# Patient Record
Sex: Female | Born: 1985 | Race: White | Hispanic: No | Marital: Married | Smoking: Never smoker
Health system: Southern US, Community
[De-identification: ages and names within clinical notes are randomized; demographics above are authoritative.]

## PROBLEM LIST (undated history)

## (undated) DIAGNOSIS — Z803 Family history of malignant neoplasm of breast: Secondary | ICD-10-CM

## (undated) DIAGNOSIS — F419 Anxiety disorder, unspecified: Secondary | ICD-10-CM

## (undated) DIAGNOSIS — Z1501 Genetic susceptibility to malignant neoplasm of breast: Secondary | ICD-10-CM

## (undated) DIAGNOSIS — Z1509 Genetic susceptibility to other malignant neoplasm: Secondary | ICD-10-CM

## (undated) DIAGNOSIS — Z8481 Family history of carrier of genetic disease: Secondary | ICD-10-CM

## (undated) HISTORY — DX: Genetic susceptibility to other malignant neoplasm: Z15.09

## (undated) HISTORY — PX: WISDOM TOOTH EXTRACTION: SHX21

## (undated) HISTORY — DX: Genetic susceptibility to malignant neoplasm of breast: Z15.01

## (undated) HISTORY — PX: BREAST BIOPSY: SHX20

## (undated) HISTORY — DX: Family history of carrier of genetic disease: Z84.81

## (undated) HISTORY — DX: Family history of malignant neoplasm of breast: Z80.3

---

## 2017-04-18 NOTE — L&D Delivery Note (Signed)
Delivery Note Pt pushed well.  At 5:27 AM a viable female was delivered via Vaginal, Spontaneous (Presentation: vtx; ROA ).  APGAR: 8, 9; weight  pending.  Baby initially did well, good sats, NICU called for eval due to grunting Placenta status: spontaneous, intact.  Cord:  with the following complications: none.  Anesthesia:  Epidural Episiotomy: None Lacerations: 1st degree Suture Repair: 3.0 vicryl rapide Est. Blood Loss (mL): 200  Mom to postpartum.  Baby to Couplet care / Skin to Skin, possibly to NICU.  Will do circ in hospital prior to discharge  Leighton Roachodd D Valori Hollenkamp 06/18/2017, 5:53 AM

## 2017-06-17 ENCOUNTER — Encounter (HOSPITAL_COMMUNITY): Payer: Self-pay | Admitting: *Deleted

## 2017-06-17 ENCOUNTER — Inpatient Hospital Stay (HOSPITAL_COMMUNITY)
Admission: AD | Admit: 2017-06-17 | Discharge: 2017-06-20 | DRG: 805 | Disposition: A | Discharge status: Home / Self Care | Payer: Commercial Managed Care - PPO | Source: Ambulatory Visit | Attending: Obstetrics and Gynecology | Admitting: Obstetrics and Gynecology

## 2017-06-17 DIAGNOSIS — O99824 Streptococcus B carrier state complicating childbirth: Secondary | ICD-10-CM | POA: Diagnosis present

## 2017-06-17 DIAGNOSIS — O99214 Obesity complicating childbirth: Secondary | ICD-10-CM | POA: Diagnosis present

## 2017-06-17 DIAGNOSIS — Z3A35 35 weeks gestation of pregnancy: Secondary | ICD-10-CM

## 2017-06-17 DIAGNOSIS — F419 Anxiety disorder, unspecified: Secondary | ICD-10-CM | POA: Diagnosis present

## 2017-06-17 DIAGNOSIS — O42919 Preterm premature rupture of membranes, unspecified as to length of time between rupture and onset of labor, unspecified trimester: Secondary | ICD-10-CM | POA: Diagnosis present

## 2017-06-17 DIAGNOSIS — O42913 Preterm premature rupture of membranes, unspecified as to length of time between rupture and onset of labor, third trimester: Principal | ICD-10-CM | POA: Diagnosis present

## 2017-06-17 DIAGNOSIS — O99344 Other mental disorders complicating childbirth: Secondary | ICD-10-CM | POA: Diagnosis present

## 2017-06-17 DIAGNOSIS — E669 Obesity, unspecified: Secondary | ICD-10-CM | POA: Diagnosis present

## 2017-06-17 HISTORY — DX: Anxiety disorder, unspecified: F41.9

## 2017-06-17 LAB — POCT FERN TEST: POCT Fern Test: POSITIVE

## 2017-06-17 MED ORDER — SERTRALINE HCL 100 MG PO TABS
100.0000 mg | ORAL_TABLET | Freq: Every day | ORAL | Status: DC
  Administered 2017-06-18: 100 mg via ORAL
  Filled 2017-06-17: qty 1

## 2017-06-17 MED ORDER — BETAMETHASONE SOD PHOS & ACET 6 (3-3) MG/ML IJ SUSP
12.0000 mg | INTRAMUSCULAR | Status: DC
  Administered 2017-06-17: 12 mg via INTRAMUSCULAR
  Filled 2017-06-17: qty 2

## 2017-06-17 NOTE — MAU Provider Note (Signed)
History     CSN: [id number]  Arrival date and time: 06/17/17 2242   First Provider Initiated Contact with Patient 06/17/17 2316      Chief Complaint  Patient presents with  . Rupture of Membranes   HPI Terri Phillips is a 32 y.o. G1P0 at [redacted]w[redacted]d who presents with possible rupture of membranes. She states around 10pm she noticed a big gush of clear fluid and has continued to leak clear to pink tinged fluid since. She reports some mucous discharge as well. She reports some tightening in her abdomen but no pain. Reports good fetal movement. Denies any problems in this pregnancy.   OB History    Gravida Para Term Preterm AB Living   1             SAB TAB Ectopic Multiple Live Births                  Past Medical History:  Diagnosis Date  . Anxiety     Past Surgical History:  Procedure Laterality Date  . WISDOM TOOTH EXTRACTION      Family History  Problem Relation Age of Onset  . Cancer Mother     Social History   Tobacco Use  . Smoking status: Never Smoker  . Smokeless tobacco: Never Used  Substance Use Topics  . Alcohol use: No    Frequency: Never  . Drug use: No    Allergies: Not on File  Medications Prior to Admission  Medication Sig Dispense Refill Last Dose  . Prenatal Vit-Fe Fumarate-FA (PRENATAL MULTIVITAMIN) TABS tablet Take 1 tablet by mouth daily at 12 noon.   06/17/2017 at Unknown time  . sertraline (ZOLOFT) 100 MG tablet Take 100 mg by mouth daily.   06/17/2017 at Unknown time    Review of Systems  Constitutional: Negative.  Negative for fatigue and fever.  HENT: Negative.   Respiratory: Negative.  Negative for shortness of breath.   Cardiovascular: Negative.  Negative for chest pain.  Gastrointestinal: Negative.  Negative for abdominal pain, constipation, diarrhea, nausea and vomiting.  Genitourinary: Positive for vaginal discharge. Negative for dysuria.  Neurological: Negative.  Negative for dizziness and headaches.   Physical Exam   Blood  pressure 124/78, pulse 88, temperature 98.3 F (36.8 C), resp. rate 18, height 5\' 4"  (1.626 m), weight 176 lb (79.8 kg).  Physical Exam  Nursing note and vitals reviewed. Constitutional: She is oriented to person, place, and time. She appears well-developed and well-nourished. No distress.  HENT:  Head: Normocephalic.  Eyes: Pupils are equal, round, and reactive to light.  Cardiovascular: Normal rate, regular rhythm and normal heart sounds.  Respiratory: Effort normal and breath sounds normal. No respiratory distress.  GI: Soft. Bowel sounds are normal. She exhibits no distension. There is no tenderness.  Neurological: She is alert and oriented to person, place, and time.  Skin: Skin is warm and dry.  Psychiatric: She has a normal mood and affect. Her behavior is normal. Judgment and thought content normal.    MAU Course  Procedures Results for orders placed or performed during the hospital encounter of 06/17/17 (from the past 24 hour(s))  Fern Test     Status: Normal   Collection Time: 06/17/17 11:22 PM  Result Value Ref Range   POCT Fern Test Positive = ruptured amniotic membanes    MDM Fern test Consulted with Dr. Jackelyn Knife- will give BMZ and collect GBS swab. MD to put in orders for admission.   Assessment and  Plan   1. Preterm premature rupture of membranes in third trimester, unspecified duration to onset of labor   2. [redacted] weeks gestation of pregnancy    Care turned over to Dr. Jackelyn KnifeMeisinger.  Rolm BookbinderCaroline M Neill CNM 06/17/2017, 11:21 PM

## 2017-06-17 NOTE — MAU Note (Signed)
About an hour ago started leaking clear fld with some bloody mucous noted at times. Cont to leak fld off and on. No pain

## 2017-06-18 ENCOUNTER — Encounter (HOSPITAL_COMMUNITY): Payer: Self-pay

## 2017-06-18 ENCOUNTER — Inpatient Hospital Stay (HOSPITAL_COMMUNITY): Payer: Commercial Managed Care - PPO | Admitting: Anesthesiology

## 2017-06-18 ENCOUNTER — Other Ambulatory Visit: Payer: Self-pay

## 2017-06-18 DIAGNOSIS — E669 Obesity, unspecified: Secondary | ICD-10-CM | POA: Diagnosis present

## 2017-06-18 DIAGNOSIS — F419 Anxiety disorder, unspecified: Secondary | ICD-10-CM | POA: Diagnosis present

## 2017-06-18 DIAGNOSIS — O99344 Other mental disorders complicating childbirth: Secondary | ICD-10-CM | POA: Diagnosis present

## 2017-06-18 DIAGNOSIS — Z3A35 35 weeks gestation of pregnancy: Secondary | ICD-10-CM | POA: Diagnosis not present

## 2017-06-18 DIAGNOSIS — O42919 Preterm premature rupture of membranes, unspecified as to length of time between rupture and onset of labor, unspecified trimester: Secondary | ICD-10-CM | POA: Diagnosis present

## 2017-06-18 DIAGNOSIS — O42913 Preterm premature rupture of membranes, unspecified as to length of time between rupture and onset of labor, third trimester: Secondary | ICD-10-CM | POA: Diagnosis present

## 2017-06-18 DIAGNOSIS — O99824 Streptococcus B carrier state complicating childbirth: Secondary | ICD-10-CM | POA: Diagnosis present

## 2017-06-18 DIAGNOSIS — O99214 Obesity complicating childbirth: Secondary | ICD-10-CM | POA: Diagnosis present

## 2017-06-18 LAB — RPR: RPR Ser Ql: NONREACTIVE

## 2017-06-18 LAB — CBC
HCT: 34.4 % — ABNORMAL LOW (ref 36.0–46.0)
HEMOGLOBIN: 11.7 g/dL — AB (ref 12.0–15.0)
MCH: 30.8 pg (ref 26.0–34.0)
MCHC: 34 g/dL (ref 30.0–36.0)
MCV: 90.5 fL (ref 78.0–100.0)
PLATELETS: 195 10*3/uL (ref 150–400)
RBC: 3.8 MIL/uL — AB (ref 3.87–5.11)
RDW: 13.9 % (ref 11.5–15.5)
WBC: 13.5 10*3/uL — AB (ref 4.0–10.5)

## 2017-06-18 LAB — GROUP B STREP BY PCR: Group B strep by PCR: POSITIVE — AB

## 2017-06-18 MED ORDER — BUTORPHANOL TARTRATE 1 MG/ML IJ SOLN: 1.0000 mg | INTRAMUSCULAR | Status: DC | PRN

## 2017-06-18 MED ORDER — EPHEDRINE 5 MG/ML INJ
10.0000 mg | INTRAVENOUS | Status: DC | PRN
  Filled 2017-06-18: qty 2

## 2017-06-18 MED ORDER — DIPHENHYDRAMINE HCL 50 MG/ML IJ SOLN: 12.5000 mg | INTRAMUSCULAR | Status: DC | PRN

## 2017-06-18 MED ORDER — ACETAMINOPHEN 325 MG PO TABS: 650.0000 mg | ORAL_TABLET | ORAL | Status: DC | PRN

## 2017-06-18 MED ORDER — TETANUS-DIPHTH-ACELL PERTUSSIS 5-2.5-18.5 LF-MCG/0.5 IM SUSP: 0.5000 mL | Freq: Once | INTRAMUSCULAR | Status: DC

## 2017-06-18 MED ORDER — FENTANYL 2.5 MCG/ML BUPIVACAINE 1/10 % EPIDURAL INFUSION (WH - ANES)
14.0000 mL/h | INTRAMUSCULAR | Status: DC | PRN
  Administered 2017-06-18: 14 mL/h via EPIDURAL
  Filled 2017-06-18: qty 100

## 2017-06-18 MED ORDER — SERTRALINE HCL 100 MG PO TABS
100.0000 mg | ORAL_TABLET | Freq: Every day | ORAL | Status: DC
  Administered 2017-06-18 – 2017-06-19 (×2): 100 mg via ORAL
  Filled 2017-06-18 (×3): qty 1

## 2017-06-18 MED ORDER — BENZOCAINE-MENTHOL 20-0.5 % EX AERO
1.0000 "application " | INHALATION_SPRAY | CUTANEOUS | Status: DC | PRN
  Administered 2017-06-18 – 2017-06-20 (×2): 1 via TOPICAL
  Filled 2017-06-18 (×2): qty 56

## 2017-06-18 MED ORDER — METHYLERGONOVINE MALEATE 0.2 MG PO TABS: 0.2000 mg | ORAL_TABLET | ORAL | Status: DC | PRN

## 2017-06-18 MED ORDER — OXYTOCIN BOLUS FROM INFUSION
500.0000 mL | Freq: Once | INTRAVENOUS | Status: AC
  Administered 2017-06-18: 500 mL via INTRAVENOUS

## 2017-06-18 MED ORDER — ONDANSETRON HCL 4 MG/2ML IJ SOLN: 4.0000 mg | Freq: Four times a day (QID) | INTRAMUSCULAR | Status: DC | PRN

## 2017-06-18 MED ORDER — LACTATED RINGERS IV SOLN: 500.0000 mL | Freq: Once | INTRAVENOUS | Status: DC

## 2017-06-18 MED ORDER — LACTATED RINGERS IV SOLN: 500.0000 mL | INTRAVENOUS | Status: DC | PRN

## 2017-06-18 MED ORDER — OXYCODONE HCL 5 MG PO TABS: 10.0000 mg | ORAL_TABLET | ORAL | Status: DC | PRN

## 2017-06-18 MED ORDER — OXYTOCIN 40 UNITS IN LACTATED RINGERS INFUSION - SIMPLE MED: 2.5000 [IU]/h | INTRAVENOUS | Status: DC

## 2017-06-18 MED ORDER — LIDOCAINE HCL (PF) 1 % IJ SOLN
INTRAMUSCULAR | Status: DC | PRN
  Administered 2017-06-18: 5 mL via EPIDURAL
  Administered 2017-06-18: 2 mL via EPIDURAL
  Administered 2017-06-18: 3 mL via EPIDURAL

## 2017-06-18 MED ORDER — OXYCODONE-ACETAMINOPHEN 5-325 MG PO TABS: 2.0000 | ORAL_TABLET | ORAL | Status: DC | PRN

## 2017-06-18 MED ORDER — OXYCODONE-ACETAMINOPHEN 5-325 MG PO TABS: 1.0000 | ORAL_TABLET | ORAL | Status: DC | PRN

## 2017-06-18 MED ORDER — ONDANSETRON HCL 4 MG PO TABS: 4.0000 mg | ORAL_TABLET | ORAL | Status: DC | PRN

## 2017-06-18 MED ORDER — OXYCODONE HCL 5 MG PO TABS: 5.0000 mg | ORAL_TABLET | ORAL | Status: DC | PRN

## 2017-06-18 MED ORDER — SENNOSIDES-DOCUSATE SODIUM 8.6-50 MG PO TABS
2.0000 | ORAL_TABLET | ORAL | Status: DC
  Administered 2017-06-19 – 2017-06-20 (×2): 2 via ORAL
  Filled 2017-06-18 (×2): qty 2

## 2017-06-18 MED ORDER — PRENATAL MULTIVITAMIN CH
1.0000 | ORAL_TABLET | Freq: Every day | ORAL | Status: DC
  Administered 2017-06-18 – 2017-06-20 (×3): 1 via ORAL
  Filled 2017-06-18 (×3): qty 1

## 2017-06-18 MED ORDER — MAGNESIUM HYDROXIDE 400 MG/5ML PO SUSP: 30.0000 mL | ORAL | Status: DC | PRN

## 2017-06-18 MED ORDER — SODIUM CHLORIDE 0.9 % IV SOLN
5.0000 10*6.[IU] | Freq: Once | INTRAVENOUS | Status: AC
  Administered 2017-06-18: 5 10*6.[IU] via INTRAVENOUS
  Filled 2017-06-18: qty 5

## 2017-06-18 MED ORDER — IBUPROFEN 600 MG PO TABS
600.0000 mg | ORAL_TABLET | Freq: Four times a day (QID) | ORAL | Status: DC
  Administered 2017-06-18 – 2017-06-20 (×10): 600 mg via ORAL
  Filled 2017-06-18 (×10): qty 1

## 2017-06-18 MED ORDER — ONDANSETRON HCL 4 MG/2ML IJ SOLN: 4.0000 mg | INTRAMUSCULAR | Status: DC | PRN

## 2017-06-18 MED ORDER — PENICILLIN G POT IN DEXTROSE 60000 UNIT/ML IV SOLN
3.0000 10*6.[IU] | INTRAVENOUS | Status: DC
  Filled 2017-06-18 (×3): qty 50

## 2017-06-18 MED ORDER — ZOLPIDEM TARTRATE 5 MG PO TABS: 5.0000 mg | ORAL_TABLET | Freq: Every evening | ORAL | Status: DC | PRN

## 2017-06-18 MED ORDER — PHENYLEPHRINE 40 MCG/ML (10ML) SYRINGE FOR IV PUSH (FOR BLOOD PRESSURE SUPPORT)
80.0000 ug | PREFILLED_SYRINGE | INTRAVENOUS | Status: DC | PRN
  Filled 2017-06-18: qty 5
  Filled 2017-06-18: qty 10

## 2017-06-18 MED ORDER — SOD CITRATE-CITRIC ACID 500-334 MG/5ML PO SOLN: 30.0000 mL | ORAL | Status: DC | PRN

## 2017-06-18 MED ORDER — TERBUTALINE SULFATE 1 MG/ML IJ SOLN: 0.2500 mg | Freq: Once | INTRAMUSCULAR | Status: DC | PRN

## 2017-06-18 MED ORDER — LACTATED RINGERS IV SOLN
INTRAVENOUS | Status: DC
  Administered 2017-06-18: via INTRAVENOUS

## 2017-06-18 MED ORDER — METHYLERGONOVINE MALEATE 0.2 MG/ML IJ SOLN: 0.2000 mg | INTRAMUSCULAR | Status: DC | PRN

## 2017-06-18 MED ORDER — LIDOCAINE HCL (PF) 1 % IJ SOLN
30.0000 mL | INTRAMUSCULAR | Status: AC | PRN
  Administered 2017-06-18: 30 mL via SUBCUTANEOUS
  Filled 2017-06-18: qty 30

## 2017-06-18 MED ORDER — LACTATED RINGERS IV SOLN
500.0000 mL | Freq: Once | INTRAVENOUS | Status: AC
  Administered 2017-06-18: 500 mL via INTRAVENOUS

## 2017-06-18 MED ORDER — COCONUT OIL OIL
1.0000 "application " | TOPICAL_OIL | Status: DC | PRN
  Administered 2017-06-18: 1 via TOPICAL
  Filled 2017-06-18: qty 120

## 2017-06-18 MED ORDER — DIBUCAINE 1 % RE OINT: 1.0000 "application " | TOPICAL_OINTMENT | RECTAL | Status: DC | PRN

## 2017-06-18 MED ORDER — SIMETHICONE 80 MG PO CHEW: 80.0000 mg | CHEWABLE_TABLET | ORAL | Status: DC | PRN

## 2017-06-18 MED ORDER — OXYTOCIN 40 UNITS IN LACTATED RINGERS INFUSION - SIMPLE MED
1.0000 m[IU]/min | INTRAVENOUS | Status: DC
  Administered 2017-06-18: 2 m[IU]/min via INTRAVENOUS
  Filled 2017-06-18: qty 1000

## 2017-06-18 MED ORDER — MEASLES, MUMPS & RUBELLA VAC ~~LOC~~ INJ
0.5000 mL | INJECTION | Freq: Once | SUBCUTANEOUS | Status: AC
  Administered 2017-06-19: 0.5 mL via SUBCUTANEOUS
  Filled 2017-06-18 (×2): qty 0.5

## 2017-06-18 MED ORDER — WITCH HAZEL-GLYCERIN EX PADS: 1.0000 "application " | MEDICATED_PAD | CUTANEOUS | Status: DC | PRN

## 2017-06-18 MED ORDER — PHENYLEPHRINE 40 MCG/ML (10ML) SYRINGE FOR IV PUSH (FOR BLOOD PRESSURE SUPPORT)
80.0000 ug | PREFILLED_SYRINGE | INTRAVENOUS | Status: DC | PRN
  Filled 2017-06-18: qty 5

## 2017-06-18 MED ORDER — DIPHENHYDRAMINE HCL 25 MG PO CAPS: 25.0000 mg | ORAL_CAPSULE | Freq: Four times a day (QID) | ORAL | Status: DC | PRN

## 2017-06-18 NOTE — Anesthesia Preprocedure Evaluation (Signed)
Anesthesia Evaluation  Patient identified by MRN, date of birth, ID band Patient awake    Reviewed: Allergy & Precautions, NPO status , Patient's Chart, lab work & pertinent test results  Airway Mallampati: II  TM Distance: >3 FB Neck ROM: Full    Dental  (+) Teeth Intact, Dental Advisory Given   Pulmonary neg pulmonary ROS,    Pulmonary exam normal breath sounds clear to auscultation       Cardiovascular negative cardio ROS Normal cardiovascular exam Rhythm:Regular Rate:Normal     Neuro/Psych PSYCHIATRIC DISORDERS Anxiety negative neurological ROS     GI/Hepatic negative GI ROS, Neg liver ROS,   Endo/Other  negative endocrine ROSObesity   Renal/GU negative Renal ROS     Musculoskeletal negative musculoskeletal ROS (+)   Abdominal   Peds  Hematology negative hematology ROS (+) Blood dyscrasia, anemia , Plt 195k   Anesthesia Other Findings Day of surgery medications reviewed with the patient.  Reproductive/Obstetrics (+) Pregnancy                             Anesthesia Physical Anesthesia Plan  ASA: II  Anesthesia Plan: Epidural   Post-op Pain Management:    Induction:   PONV Risk Score and Plan: 2 and Treatment may vary due to age or medical condition  Airway Management Planned:   Additional Equipment:   Intra-op Plan:   Post-operative Plan:   Informed Consent: I have reviewed the patients History and Physical, chart, labs and discussed the procedure including the risks, benefits and alternatives for the proposed anesthesia with the patient or authorized representative who has indicated his/her understanding and acceptance.   Dental advisory given  Plan Discussed with:   Anesthesia Plan Comments: (Patient identified. Risks/Benefits/Options discussed with patient including but not limited to bleeding, infection, nerve damage, paralysis, failed block, incomplete pain  control, headache, blood pressure changes, nausea, vomiting, reactions to medication both or allergic, itching and postpartum back pain. Confirmed with bedside nurse the patient's most recent platelet count. Confirmed with patient that they are not currently taking any anticoagulation, have any bleeding history or any family history of bleeding disorders. Patient expressed understanding and wished to proceed. All questions were answered. )        Anesthesia Quick Evaluation

## 2017-06-18 NOTE — Anesthesia Postprocedure Evaluation (Signed)
Anesthesia Post Note  Patient: Terri Phillips  Procedure(s) Performed: AN AD HOC LABOR EPIDURAL     Patient location during evaluation: Mother Baby Anesthesia Type: Epidural Level of consciousness: awake and alert and oriented Pain management: satisfactory to patient Vital Signs Assessment: post-procedure vital signs reviewed and stable Respiratory status: spontaneous breathing and nonlabored ventilation Cardiovascular status: stable Postop Assessment: no headache, no backache, no signs of nausea or vomiting, adequate PO intake and patient able to bend at knees (patient up walking) Anesthetic complications: no    Last Vitals:  Vitals:   06/18/17 0704 06/18/17 0750  BP: 116/65 (!) 143/71  Pulse: 64 65  Resp: 16 18  Temp: 36.8 C 36.8 C  SpO2: 100% 99%    Last Pain:  Vitals:   06/18/17 0750  TempSrc: Oral  PainSc:    Pain Goal:                 Madison HickmanGREGORY,Shalamar Plourde

## 2017-06-18 NOTE — Progress Notes (Signed)
PPD #0 With baby in NICU, no problems Afeb, VSS Continue routine postpartum care

## 2017-06-18 NOTE — Anesthesia Procedure Notes (Signed)
Epidural Patient location during procedure: OB Start time: 06/18/2017 4:21 AM End time: 06/18/2017 4:26 AM  Staffing Anesthesiologist: Cecile Hearingurk, Sharif Rendell Edward, MD Performed: anesthesiologist   Preanesthetic Checklist Completed: patient identified, pre-op evaluation, timeout performed, IV checked, risks and benefits discussed and monitors and equipment checked  Epidural Patient position: sitting Prep: DuraPrep Patient monitoring: blood pressure and continuous pulse ox Approach: midline Location: L3-L4 Injection technique: LOR air  Needle:  Needle type: Tuohy  Needle gauge: 17 G Needle length: 9 cm Needle insertion depth: 5 cm Catheter size: 19 Gauge Catheter at skin depth: 10 cm Test dose: negative and Other (1% Lidocaine)  Additional Notes Patient identified.  Risk benefits discussed including failed block, incomplete pain control, headache, nerve damage, paralysis, blood pressure changes, nausea, vomiting, reactions to medication both toxic or allergic, and postpartum back pain.  Patient expressed understanding and wished to proceed.  All questions were answered.  Sterile technique used throughout procedure and epidural site dressed with sterile barrier dressing. No paresthesia or other complications noted. The patient did not experience any signs of intravascular injection such as tinnitus or metallic taste in mouth nor signs of intrathecal spread such as rapid motor block. Please see nursing notes for vital signs. Reason for block:procedure for pain

## 2017-06-18 NOTE — H&P (Signed)
Terri Phillips is a 32 y.o. female, G2 19P0010, EGA [redacted]W[redacted]D with EDC 07-21-17 presenting for leaking fluid since around 2200 last pm.  Eval in MAU confirmed PPROM, VE 2 cm, occ ctx.  Pt admitted and started on pitocin, received a dose of betamethasone, rapid GBS sent and is positive so started on PCN.  Prenatal care essentially uncomplicated, possible vanishing twin at 6 weeks, anxiety controlled with Zoloft.  OB History    Gravida Para Term Preterm AB Living   2       1 0   SAB TAB Ectopic Multiple Live Births   1             Past Medical History:  Diagnosis Date  . Anxiety    Past Surgical History:  Procedure Laterality Date  . WISDOM TOOTH EXTRACTION     Family History: family history includes Cancer in her mother. Social History:  reports that  has never smoked. she has never used smokeless tobacco. She reports that she does not drink alcohol or use drugs.     Maternal Diabetes: No Genetic Screening: Normal Maternal Ultrasounds/Referrals: Normal Fetal Ultrasounds or other Referrals:  None Maternal Substance Abuse:  No Significant Maternal Medications:  Meds include: Zoloft Significant Maternal Lab Results:  Lab values include: Group B Strep positive Other Comments:  PPROM  Review of Systems  Respiratory: Negative.   Cardiovascular: Negative.    Maternal Medical History:  Reason for admission: Rupture of membranes.   Contractions: Frequency: irregular.   Perceived severity is mild.    Fetal activity: Perceived fetal activity is normal.    Prenatal complications: no prenatal complications Prenatal Complications - Diabetes: none.    Dilation: 10 Effacement (%): 70 Station: Plus 1, Plus 2 Exam by:: Jaclyn PrimeK Wheatley, RN(verified by Virl CageyBrooke Boyer, RN) Blood pressure (!) 104/48, pulse (!) 106, temperature 97.9 F (36.6 C), temperature source Oral, resp. rate 18, height 5\' 4"  (1.626 m), weight 176 lb (79.8 kg), SpO2 99 %. Maternal Exam:  Uterine Assessment: Contraction  strength is moderate.  Contraction frequency is regular.   Abdomen: Patient reports no abdominal tenderness. Estimated fetal weight is 5-6 lbs.   Fetal presentation: vertex  Introitus: Normal vulva. Normal vagina.  Ferning test: positive.  Amniotic fluid character: clear.  Pelvis: adequate for delivery.   Cervix: Cervix evaluated by digital exam.     Fetal Exam Fetal Monitor Review: Mode: ultrasound.   Baseline rate: 150.  Variability: moderate (6-25 bpm).   Pattern: accelerations present and variable decelerations.    Fetal State Assessment: Category II - tracings are indeterminate.     Physical Exam  Vitals reviewed. Constitutional: She appears well-developed and well-nourished.  Cardiovascular: Normal rate and regular rhythm.  Respiratory: Effort normal. No respiratory distress.  GI: Soft.    Prenatal labs: ABO, Rh: --/--/O NEG (03/03 0015) Antibody: POS (03/03 0015) Rubella:  non-immune RPR:   NR HBsAg:   Neg HIV:   NR GBS:   Pos  Assessment/Plan: IUP at 35 weeks with PPROM.  She just received her epidural and has progressed to complete, received betamethasone, is on PCN for +GBS.  Will anticipate SVD.     Leighton Roachodd D Raetta Agostinelli 06/18/2017, 5:06 AM

## 2017-06-19 NOTE — Progress Notes (Signed)
Patient ID: Terri MiresCarissa L Phillips, female   DOB: 10/11/1985, 32 y.o.   MRN: [id number] Pt seen and examined. Lochia mild, pain well controlled. Ambulating with no issues, voiding well. Tol PO. Baby still in NICU - bonding well VSS ABD - FF and 3cm below umb EXT +1 edema b/l, no homans  A/P: PPD#1 s/p preterm svd - stable; baby also stable in NICU         Routine pp care         Likely discharge to home in am

## 2017-06-19 NOTE — Lactation Note (Signed)
This note was copied from a baby's chart. Lactation Consultation Note  Patient Name: Boy Iva BoopCarissa Dady ZOXWR'UToday's Date: 06/19/2017 Reason for consult: Initial assessment;NICU baby;Late-preterm 34-36.6wks;Infant < 6lbs;Hyperbilirubinemia   Called to infant bedside in the NICU due to nipple trauma to the right breast. Mom was holding infant STS. Mom has a red ring around the right nipple presumably from incorrect flange size. Mom reports she used the 3027 and then the 24's. Reviewed importance of proper flange fit for best milk flow. Mom has suction turned up high also. Enc mom to start on 3 gtts and increase as can but decrease with any pain.   Follow up with mom in her room on 3rd floor. Flanges refit to a size 21 with instructions to increase to size 24 tomorrow. Mom reports # 21 flanges feel the best. Mom used coconut oil prior to pumping. Mom to pump 8-12 x a day with a 4-5 hour stretch of no pumping at night for rest. Enc mom to pump after holding/visiting infant. Mom to follow pumping with hand expression. Mom was taught how to do hands on pumping. Mom given comfort gels to use post pumping and was instructed how to use and clean.   Providing Breast milk for Your Baby in NICU given and explained. Mom to call out for feeding assistance as needed. Mom has been taking small gtts of milk to the NICU for the infant.   parents report all questions/concerns have been answered at this time.      Maternal Data Formula Feeding for Exclusion: No Has patient been taught Hand Expression?: Yes Does the patient have breastfeeding experience prior to this delivery?: No  Feeding Feeding Type: Donor Breast Milk Length of feed: 30 min  LATCH Score                   Interventions    Lactation Tools Discussed/Used WIC Program: No Pump Review: Setup, frequency, and cleaning;Milk Storage Initiated by:: Bedside RN, Enc 8-12 x a day   Consult Status Consult Status: Follow-up Date:  06/20/17 Follow-up type: In-patient    Silas FloodSharon S Takara Sermons 06/19/2017, 9:58 AM

## 2017-06-19 NOTE — Plan of Care (Signed)
  Role Relationship: Ability to demonstrate positive interaction with newborn will improve 06/19/2017 0502 - Not Met (add Reason) by Ledell Noss, RN  Baby in NICU Progressing Activity: Risk for activity intolerance will decrease 06/19/2017 0502 - Progressing by Ledell Noss, RN Nutrition: Adequate nutrition will be maintained 06/19/2017 0502 - Progressing by Ledell Noss, RN Coping: Level of anxiety will decrease 06/19/2017 0502 - Progressing by Ledell Noss, RN Elimination: Will not experience complications related to bowel motility 06/19/2017 0502 - Progressing by Ledell Noss, RN Will not experience complications related to urinary retention 06/19/2017 0502 - Progressing by Ledell Noss, RN Pain Managment: General experience of comfort will improve 06/19/2017 0502 - Progressing by Ledell Noss, RN Safety: Ability to remain free from injury will improve 06/19/2017 0502 - Progressing by Ledell Noss, RN Skin Integrity: Risk for impaired skin integrity will decrease 06/19/2017 0502 - Progressing by Ledell Noss, RN Skin Integrity: Demonstration of wound healing without infection will improve 06/19/2017 0502 - Progressing by Ledell Noss, RN

## 2017-06-19 NOTE — Progress Notes (Signed)
CSW acknowledges NICU admission.    Patient screened out for psychosocial assessment since none of the following apply:  Psychosocial stressors documented in mother or baby's chart  Gestation less than 32 weeks  Code at delivery   Infant with anomalies  Please contact the Clinical Social Worker if specific needs arise, or by MOB's request.     MOB with hx of Anxiety-onset in college 2008.  Has Rx for Zoloft and PNR states well controlled. 

## 2017-06-19 NOTE — Progress Notes (Signed)
Patient ID: Terri Phillips, female   DOB: 05/05/1985, 32 y.o.   MRN: [id number] Pt not in room - likely in NICU per nursing. Has been recovering well with no complaints per nursing.  VSS reviewed and wnl Will return to assess her personally when she is back in room Routine pp care

## 2017-06-20 MED ORDER — IBUPROFEN 600 MG PO TABS: 600.0000 mg | ORAL_TABLET | Freq: Four times a day (QID) | ORAL | 1 refills | Status: DC | PRN

## 2017-06-20 MED ORDER — PRENATAL MULTIVITAMIN CH: 1.0000 | ORAL_TABLET | Freq: Every day | ORAL | 3 refills | Status: DC

## 2017-06-20 NOTE — Lactation Note (Signed)
This note was copied from a baby's chart. Lactation Consultation Note  Patient Name: Boy Iva BoopCarissa Langer WUJWJ'XToday's Date: 06/20/2017 Reason for consult: Follow-up assessment;NICU baby Mom is pumping every 3 hours and obtaining 10-20 mls.  Breasts are filling with some firm areas noted.  No engorgement.  Instructed to continue to use hands on pumping 8-12 times/24 hours.  Reviewed pumping basics.  Mom is now using a 24 mm flange and feels this is comfortable.  Answered questions.  Encouraged to call with concerns or for NICU assist.  Outpatient services discussed.  Maternal Data    Feeding Feeding Type: Donor Breast Milk Length of feed: 30 min  LATCH Score                   Interventions    Lactation Tools Discussed/Used     Consult Status Consult Status: PRN    Huston FoleyMOULDEN, Kenadi Miltner S 06/20/2017, 10:55 AM

## 2017-06-20 NOTE — Progress Notes (Signed)
Post Partum Day 2 Subjective: no complaints, up ad lib, voiding, tolerating PO and nl lochia, pain controlled  Objective: Blood pressure 116/68, pulse 79, temperature 98.4 F (36.9 C), temperature source Oral, resp. rate 18, height 5\' 4"  (1.626 m), weight 79.8 kg (176 lb), SpO2 99 %, unknown if currently breastfeeding.  Physical Exam:  General: alert and no distress Lochia: appropriate Uterine Fundus: firm  Recent Labs    06/18/17 0015  HGB 11.7*  HCT 34.4*    Assessment/Plan: Discharge home.  Routine care.  D/c with motrin and PNV.  F/u 6 weeks   LOS: 2 days   Terri Phillips 06/20/2017, 7:50 AM

## 2017-06-20 NOTE — Progress Notes (Addendum)
Discharge instructions reviewed using Baby and Me book. Pre-e s/s, normal VD recovery and lochia reviewed, pt stating that she has had Tdap in the office prior to delivery. Medications listed and f/u.  Pt and FOB verbalizes understanding.Pt is planning to leave at 6pm after infant feeding in NICU.

## 2017-06-20 NOTE — Discharge Summary (Signed)
OB Discharge Summary     Patient Name: Terri Phillips DOB: 04-14-1986 MRN: [id number]  Date of admission: 06/17/2017 Delivering MD: Jackelyn Knife, TODD   Date of discharge: 06/20/2017  Admitting diagnosis: 30 WKS, WATER BROKE Intrauterine pregnancy: [redacted]w[redacted]d     Secondary diagnosis:  Active Problems:   Preterm premature rupture of membranes (PPROM) with unknown onset of labor   SVD (spontaneous vaginal delivery)  Additional problems: N/A     Discharge diagnosis: Preterm Pregnancy Delivered                                                                                                Post partum procedures:N/A  Augmentation: N/A  Complications: None  Hospital course:  Onset of Labor With Vaginal Delivery     32 y.o. yo G2P0111 at [redacted]w[redacted]d was admitted in Active Labor on 06/17/2017. Patient had an uncomplicated labor course as follows:  Membrane Rupture Time/Date: 9:30 PM ,06/17/2017   Intrapartum Procedures: Episiotomy: None [1]                                         Lacerations:  1st degree [2]  Patient had a delivery of a Viable infant. (baby A neg) 06/18/2017  Information for the patient's newborn:  Analeya, Luallen [[id number]]  Delivery Method: Vaginal, Spontaneous(Filed from Delivery Summary)    Pateint had an uncomplicated postpartum course.  She is ambulating, tolerating a regular diet, passing flatus, and urinating well. Patient is discharged home in stable condition on 06/20/17.   Physical exam  Vitals:   06/19/17 1600 06/19/17 1959 06/20/17 0655 06/20/17 0828  BP: 122/65 114/72 116/68 (!) 142/71  Pulse: 71 (!) 56 79 80  Resp: 18 18 18 18   Temp: 98.3 F (36.8 C) 98.4 F (36.9 C) 98.4 F (36.9 C) 98.6 F (37 C)  TempSrc: Oral Oral Oral Oral  SpO2: 99% 98% 99% 99%  Weight:      Height:       General: alert and no distress Lochia: appropriate Uterine Fundus: firm  Labs: Lab Results  Component Value Date   WBC 13.5 (H) 06/18/2017   HGB 11.7 (L) 06/18/2017   HCT 34.4 (L) 06/18/2017   MCV 90.5 06/18/2017   PLT 195 06/18/2017   No flowsheet data found.  Discharge instruction: per After Visit Summary and "Baby and Me Booklet".  After visit meds:  Allergies as of 06/20/2017   No Known Allergies     Medication List    TAKE these medications   ibuprofen 600 MG tablet Commonly known as:  ADVIL,MOTRIN Take 1 tablet (600 mg total) by mouth every 6 (six) hours as needed.   prenatal multivitamin Tabs tablet Take 1 tablet by mouth daily at 12 noon.   sertraline 100 MG tablet Commonly known as:  ZOLOFT Take 100 mg by mouth at bedtime.       Diet: routine diet  Activity: Advance as tolerated. Pelvic rest for 6 weeks.   Outpatient follow up:6 weeks Follow up  Appt:No future appointments. Follow up Visit:No Follow-up on file.  Postpartum contraception: Not Discussed  Newborn Data: Live born female  Birth Weight: 6 lb 0.5 oz (2735 g) APGAR: 8, 9  Newborn Delivery   Birth date/time:  06/18/2017 05:27:00 Delivery type:  Vaginal, Spontaneous     Baby Feeding: Breast Disposition:home with mother   06/20/2017 Sherian ReinJody Bovard-Stuckert, MD

## 2017-06-22 ENCOUNTER — Ambulatory Visit: Payer: Self-pay

## 2017-06-22 LAB — TYPE AND SCREEN
ABO/RH(D): O NEG
Antibody Screen: POSITIVE
UNIT DIVISION: 0
Unit division: 0

## 2017-06-22 LAB — BPAM RBC
BLOOD PRODUCT EXPIRATION DATE: 201904062359
Blood Product Expiration Date: 201904062359
Unit Type and Rh: 9500
Unit Type and Rh: 9500

## 2017-06-22 NOTE — Lactation Note (Signed)
This note was copied from a baby's chart. Lactation Consultation Note  Patient Name: Terri Phillips WUJWJ'XToday's Date: 06/22/2017 Reason for consult: Follow-up assessment;NICU baby;Infant < 6lbs;Late-preterm 34-36.6wks;Hyperbilirubinemia;1st time breastfeeding;Primapara  Asked to assist in the NICU with "Chrissie NoaWilliam", adjusted age of 7875w6d.  He is at 7% weight loss, and has elevated bilirubin levels (>11).  Baby tried to breastfeed at previous feeding, with gavage feeding going.  Mom had stated yesterday, baby had been tried on the breast without gavage feeding, and he was so upset.  She became very stressed about this.  Mom has been pumping every 3 hrs, and obtaining about 60 ml each pumping.  Baby receiving all Mom's own breast milk.  Baby quiet, drowsy state.  Positioned baby in cross cradle hold on left breast.  Encouraged Mom to use breast massage, and hand expression to stimulate milk flow.  Milk easy to express from nipple.  Left nipple noted to have slight bruising on tip.  Mom denies pain.  Encouraged to use coconut oil, and to not increase pump strength high so it is uncomfortable.  Baby opened slightly, and Mom brought breast to baby.  His mouth was on her nipple tip.  With explanation, took baby off, and repositioned Mom and baby.  Repeated attempts to latch baby, Mom using a U hold, and baby able to attain a deep areolar latch, but once he starts sucking, he pulls to nipple.  Teaching done on how to do a gently chin tug to open lower jaw and uncurl lower lip.  Initiated a 20 mm nipple shield, with instructions on use and care.  Mom's nipple pulled nicely into shield.  Instilled some EBM into nipple shield. Baby eventually did latch, and give a few sucks.  Parents taught importance of depth on the breast, sucking with jaw movements, and how to identify swallowing.   Mom taught how to use alternate breast compression during sucking as well.    Praised Mom for being diligent with regards to  pumping, and how plentiful her milk is.  Mom encouraged to let baby stay STS and get remainder of gavage feeding.  Talked about holding off on gavage feeding at beginning of next feeding, to see if baby can latch when showing cues.  Talked about affect of jaundice on newborns with regards to affective sucking and milk transfer.    Encouraged Mom to double pump in NICU pumping room.  Reminded her to relax and deep breath with good support of her arms and back.     Feeding Feeding Type: Breast Fed Length of feed: 5 min  LATCH Score Latch: Repeated attempts needed to sustain latch, nipple held in mouth throughout feeding, stimulation needed to elicit sucking reflex.  Audible Swallowing: None  Type of Nipple: Everted at rest and after stimulation  Comfort (Breast/Nipple): Filling, red/small blisters or bruises, mild/mod discomfort  Hold (Positioning): Assistance needed to correctly position infant at breast and maintain latch.  LATCH Score: 5  Interventions Interventions: Breast feeding basics reviewed;Assisted with latch;Skin to skin;Breast massage;Hand express;Breast compression;Adjust position;Support pillows;Position options;Expressed milk;DEBP  Lactation Tools Discussed/Used Tools: Pump;Nipple Shields Nipple shield size: 20 Breast pump type: Double-Electric Breast Pump   Consult Status Consult Status: Follow-up Date: 06/22/17 Follow-up type: Call as needed    Judee ClaraSmith, Janae Bonser E 06/22/2017, 12:53 PM

## 2017-06-24 ENCOUNTER — Ambulatory Visit: Payer: Self-pay

## 2017-06-24 NOTE — Lactation Note (Signed)
This note was copied from a baby's chart. Lactation Consultation Note; Called to assist mom with latch of NICU baby. He had been nursing on first breast for 12 min without NS ( because they could not find NS) On and off the breast. Latched better with NS. Mom feels breasts have gotten a little softer. Praise given for her efforts. Reports pumping is hurting # 27 flanges given to try. Nipples look intact- slightly pink. Comfort gels given with instructions for use. No questions at present To call prn  Patient Name: Terri Iva BoopCarissa Gacek UJWJX'BToday's Date: 06/24/2017 Reason for consult: Follow-up assessment;Late-preterm 34-36.6wks   Maternal Data    Feeding Feeding Type: Breast Fed Length of feed: 20 min  LATCH Score Latch: Grasps breast easily, tongue down, lips flanged, rhythmical sucking.  Audible Swallowing: A few with stimulation  Type of Nipple: Everted at rest and after stimulation  Comfort (Breast/Nipple): Filling, red/small blisters or bruises, mild/mod discomfort  Hold (Positioning): Assistance needed to correctly position infant at breast and maintain latch.  LATCH Score: 7  Interventions    Lactation Tools Discussed/Used Tools: Nipple Shields;Comfort gels Nipple shield size: 20 Breast pump type: Double-Electric Breast Pump   Consult Status Consult Status: Follow-up    Pamelia HoitWeeks, Netanya Yazdani D 06/24/2017, 10:10 AM

## 2017-07-05 ENCOUNTER — Ambulatory Visit: Payer: Self-pay

## 2017-07-05 NOTE — Lactation Note (Signed)
This note was copied from a baby's chart. 07/05/2017  Name: Terri Phillips MRN: [id number] Date of Birth: 06/18/2015 Gestational Age: Gestational Age: [redacted]w[redacted]d Birth Weight: 96.5 oz Weight today:    5 pounds 13.4 ounces (2650 grams) with clean newborn diaper.  Terri Phillips has gained 10 grams in the last 9 days. Discussed with mom that normal weight gain is 15-30 grams a day with goal of 30 grams a day.   Terri Phillips is a 25 day old infant who presents today for feeding assessment. Terri Phillips was born at 67 w 2 d with an adjusted age of 5 w 5 days today. Terri Phillips was in the NICU for 8 days after birth. Mom reports infant is sleepy at the breast and has lost weight since leaving the hospital on 3/11. Infant was last fed around 10:30.   Infant is active and alert and is noted to be jaundiced in appearance.   Infant was weighed and mom latched him to the left breast with the # 20 NS. Infant was alert and active with feeding and swallows were noted. He did need his upper lip flanged after latch.   Mom reports she is continuing to use the NS as it worked well. She reports he is able to latch without it. Enc mom to wean it if able as it is not without risks.   Mom then removed NS and infant latched to the left breast. Infant latched well with flanged lips and intermittent suckles. Infant tired easily at the breast. Infant fed off and on for 15 minutes and self detached. Infant transferred 8 ml on the left breast.   Infant then latched to the right breast in the cross cradle hold. Infant fed for about 10 minutes and transferred 6 ml. Placed 5 french feeding tube to the breast and infant still sleepy and did not pull milk through syringe very well. Mom pushed milk slowly through the tube. Infant then transferred 12 ml from syringe and 8 ml from mom.   Infant did take another 15 cc in the Tommee Tippee after BF. Infant tolerated feeding well. Mom fed him on his side. Infant sleep post feeding. Infant ate 49 ml  total for this feeding.   Discussed importance of calories for Terri Phillips and that BF time may need to be limited to make sure infant gets the extra calories that he needs. Discussed maintaining pumping to protect milk supply immediately after BF. Mom to change to Dr. Irving Burton Preemie nipple that she has at home. Mom feeds using the paced bottle feeding method.   Infant with thick labial that inserts at the bottom of gum ridge, upper lip is tight with flanging and blanches with flanging. Infant with thin posterior lingual frenulum. He is able to extend and lateralize tongue well. He is noted to have decreased mid tongue elevation although he can lift tip of tongue to gumline. Infant with good tongue cupping and extension when suckling on gloved finger. Infant is noted to increase clicking as feeding progresses. Mom reports infant spits up after bottle feeding. Will reassess at next feeding to determine if needs to see oral specialist.   Unsure how tongue/lip restrictions interplay in infant feeding as infant is early term, > 6 pounds, jaundiced in appearance, sleepy at the breast, and with tongue and lip restrictions, all can cause poor or slow weight gain in an infant.   Mom given information on Fenugreek to start if pumping does not yield 30-60 ml/pumping. Mom to talk to [facility]  about before starting.   Infant to follow up with Ped tomorrow. Infant to follow up with [facility] next Friday. Infant to follow up with Lactation next week.  Mom reports all questions have been answered.   General Information: Mother's reason for visit: weight loss since leaving hospital Consult: Initial Lactation consultant: Terri StainSharon Justice Milliron RN,IBCLC Breastfeeding experience: sleepy at breast, losing weight   Maternal medications: Pre-natal vitamin, Other(Sertraline)  Breastfeeding History: Frequency of breast feeding: every 2-3 hours Duration of feeding: 20-30 minutes  Supplementation: Supplement method: bottle(Tommie  Tippee)         Breast milk volume: 30 ml Breast milk frequency: none in the bottle in the last few days   Pump type: Symphony Pump frequency: 1-2 x a day Pump volume: 20-40 ml  Infant Output Assessment: Voids per 24 hours: 6-8  Urine color: Clear yellow Stools per 24 hours: 8 (yellow, green, or brown) Stool color: Yellow  Breast Assessment: Breast: Soft Nipple: Erect   Pain interventions: Bra, Coconut oil  Feeding Assessment: Infant oral assessment: Variance Infant oral assessment comment: Infant with thick labial that inserts at the bottom of gum ridge, upper lip is tight with flanging and blanches with flanging. Infant with thin posterior lingual frenulum. He is able to extend and lateralize tongue well. He is noted to have decreased mid tongue elevation although he can lift tip of tongue to gumline. Infant with good tongue cupping and extension when suckling on gloved finger. Infant is noted to increase clicking as feeding progresses. Mom reports infant spits up after bottle feeding.  Positioning: Forensic psychologistCross cradle Latch: 1 - Repeated attempts needed to sustain latch, nipple held in mouth throughout feeding, stimulation needed to elicit sucking reflex. Audible swallowing: 1 - A few with stimulation Type of nipple: 2 - Everted at rest and after stimulation Comfort: 2 - Soft/non-tender Hold: 2 - No assistance needed to correctly position infant at breast LATCH score: 8 Latch assessment: Deep Lips flanged: No Suck assessment: Displays both Tools: Nipple shield 20 mm(NS with half of feeding) Pre-feed weight: 2650 mg Post feed weight: 2664 mg Amount transferred: 14 Amount supplemented: 0  Additional Feeding Assessment: Infant oral assessment: Variance Infant oral assessment comment: see above Positioning: Cross cradle(right breast) Latch: 1 - Repeated attempts neede to sustain latch, nipple held in mouth throughout feeding, stimulation needed to elicit sucking  reflex. Audible swallowing: 1 - A few with stimulation Type of nipple: 2 - Everted at rest and after stimulation Comfort: 2 - Soft/non-tender Hold: 2 - No assistance needed to correctly position infant at breast LATCH score: 8 Latch assessment: Deep Lips flanged: Yes   Tools: Syringe with 5 Fr feeding tube Pre-feed weight: 2664 grams Post feed weight: 3684 grams Amount transferred: 8 ml Amount supplemented: 12 ml  Totals: Total amount transferred: 22 ml Total supplement given: 12 Total amount pumped post feed: 0   Plan:  1. Feed infant at the breast with feeding cues for at least 8 feedings in 24 hours. Infant can go up to 4 hours once at night if he is eating more frequently during the day 2. Keep infant awake at the breast by stimulating infant as needed 3. Massage/compress breast with feeding 4. Use the 5 french feeding tube at the breast with 30 ml of breast milk for supplement 5. Infant sleepy at the breast and not feeding well, limit breast feeding to 20 minutes and feed remainder with bottle. If active can feed for 30 minutes. 6. If infant does not  take 30 ml of supplement at the breast finish breast milk in a bottle afterwards 7. Terri Phillips needs about 50-65 ml with 8 feedings a day (390 ml-520 ml or 13 oz-17 oz a day) 8. Pump 6-8 x a day after breast feeding for 10-20  minutes to empty both breasts with double electric breast pump to protect milk supply and to supplement the infant 9. Keep up the good work 10. Call with any questions/concerns as needed [phone] 11. Follow up with Lactation next week 12. Paced Bottle feeding on [email or web address]  [facility] Consultant RN, IBCLC                                                            Silas Flood Lion Fernandez 07/05/2017, 11:40 AM

## 2017-07-12 ENCOUNTER — Ambulatory Visit: Payer: Self-pay

## 2017-07-12 NOTE — Lactation Note (Signed)
This note was copied from a baby's chart. 07/12/2017  Name: Terri Phillips MRN: [id number] Date of Birth: 06/18/2015 Gestational Age: Gestational Age: [redacted]w[redacted]d Birth Weight: 96.5 oz Weight today:   6 pounds 5.5 ounces (2876 grams) with clean newborn diaper.   Terri Phillips has gained 226 grams in the last 7 days with an average daily weight gain of 32 grams a day. Infant is now over birthweight.   Terri Phillips is a 41 week old late preterm infant who is now 18 w 5 d adjusted age. He presents today with mom for follow up feeding assessment. Mom reports her milk supply decreased last week and they had to start Neosure 22 calorie formula to supplement with.   Mom is trying to pump after each feeding and is pumping about 15 ml. She is supplementing infant with breast milk and formula after each feeding. Mom has been using the Symphony pump and is changing to the [facility]. Discussed with mom the Free Style may not maintain her milk supply as well as the hospital grade pump. Mom is using size 24 flanges with pumping.   Mom reports infant is mostly self awakening and if not they are awakening him every 4 hours to feed, he usually awakens himself.   Mom reports infant is sleepy at the breast most of the time. Mom is no longer using the NS with feedings. Mom is not using the tube at the breast, she is bottle feeding infant after BF. She is using the Tommie Tippee and Dr. Theora Gianotti bottles for feedings. Mom is having some discomfort with initial latch that improves with feedings. Mom offers both breasts with each feeding. Infant feeds up to 30 minutes a feeding at the breast.   Mom latched infant to the right breast in the cross cradle hold. He fed for about 10 minutes and transferred 22 ml. Infant with flanged lips and rhythmic suckles. Infant with very little intermittent clicking. Infant was weighed and then latched to the left  breast in the cross cradle hold for about 15 minutes and transferred ml. Nipple was  slightly compressed post BF. Mom did not bring bottle with her, she is planning to go home and give a bottle of formula.   Infant with thick labial frenulum that inserts near the bottom of the gum ridge. Infant with posterior lingual frenulum with limited mid tongue elevation. Infant with good tongue extension at rest and lateralization. Infant does keep tongue back towards gumline when sucking on gloved finger. Infant with weak suction on gloved finger and releases finger easily with tension.  Mom has information on Tongue/Lip tie resources and local providers. Discussed that infant may have difficulty with transfer and milk supply can diminish with tongue/lip restrictions.   Handout on Fenugreek given and enc mom to take and continue pumping if she would like to increase supply. Mom to talk to [facility] prior to taking. She is drinking Mother's Milk Tea.   Infant to return to Dr. Early Osmond on April 5th. Infant to follow up with Lactation in 2 weeks.     General Information: Mother's reason for visit: Follow up feeding assistance Consult: Follow-up Lactation consultant: Noralee Stain RN,IBCLC Breastfeeding experience: sleepy at breast, latching better without the nipple shield   Maternal medications: Pre-natal vitamin, Other(Sertraline)  Breastfeeding History: Frequency of breast feeding: every 2-3 hours Duration of feeding: 20-30 minutes  Supplementation: Supplement method: bottle(Tommie Tippee, Dr. Theora Gianotti) Brand: Similac(Neosure 22 calorie) Formula volume: 30-60 ml Formula frequency: 6 x a day  Breast milk volume: 30-60 ml Breast milk frequency: 2 x a day plus BF   Pump type: Symphony(changing to Free Style) Pump frequency: 6-8 x a day Pump volume: 15 ml  Infant Output Assessment: Voids per 24 hours: 8+ Urine color: Clear yellow Stools per 24 hours: 3-4  Stool color: Yellow  Breast Assessment: Breast: Soft Nipple: Erect Pain level: 0 Pain interventions: Bra, Coconut  oil  Feeding Assessment: Infant oral assessment: Variance Infant oral assessment comment: Infant with thick labial frenulum that inserts near the bottom of the gum ridge. Infant with posterior lingual frenulum with limited mid tongue elevation. Infant with good tongue extension at rest and lateralization. Infant does keep tongue back towards gumline when sucking on gloved finger. Infant with weak suction on gloved finger and releases finger easily with tension.  Mom has information on Tongue/Lip tie resources and local providers. Discussed that infant may have difficulty with transfer and milk supply can diminish with tongue/lip restrictions.  Positioning: Cross cradle(right breast) Latch: 2 - Grasps breast easily, tongue down, lips flanged, rhythmical sucking. Audible swallowing: 2 - Spontaneous and intermittent Type of nipple: 2 - Everted at rest and after stimulation Comfort: 2 - Soft/non-tender Hold: 2 - No assistance needed to correctly position infant at breast LATCH score: 10 Latch assessment: Deep Lips flanged: Yes Suck assessment: Displays both   Pre-feed weight: 2876 grams Post feed weight: 2898 grams Amount transferred: 22 ml Amount supplemented: 0  Additional Feeding Assessment: Infant oral assessment: Variance Infant oral assessment comment: soo above Positioning: Cross cradle(left breast) Latch: 1 - Repeated attempts neede to sustain latch, nipple held in mouth throughout feeding, stimulation needed to elicit sucking reflex. Audible swallowing: 2 - Spontaneous and intermittent Type of nipple: 2 - Everted at rest and after stimulation Comfort: 2 - Soft/non-tender Hold: 2 - No assistance needed to correctly position infant at breast LATCH score: 9 Latch assessment: Deep Lips flanged: Yes Suck assessment: Displays both   Pre-feed weight: 2898 grams Post feed weight: 2906 grams Amount transferred: 8 ml Amount supplemented: 0  Totals: Total amount transferred:  30 Total supplement given: 0 Total amount pumped post feed: 0    Plan:  1. Continue to offer the breast with each feeding as mom and infant desire 2. Keep infant awake with feeding as needed 3. Massage/compress breast with feeding 4. Empty one breast before offering the second breast 5. Offer infant supplement of breast milk or formula at the breast with the 5 french feeding tube or in the bottle after breast feeding, start with 30 ml and increase as infant wants.  6. Terri Phillips needs 53-70 ml with 8 feedings a day 7. Consider having infant evaluated by oral specialist 8. Consider taking Fenugreek 2-3 capsules 3 x a day after talking with OB 9. Continue pumping 6-8 x a day post breastfeeding for 10-15 minutes 10. Keep up with good work! 11. Thank you for allowing me to assist you today 12. Call for assistance as needed [phone] 13. Follow up with Lactation in 2 weeks     Ed BlalockSharon S Delshawn Stech RN, [employer] CorporationBCLC

## 2017-07-26 ENCOUNTER — Ambulatory Visit: Payer: Self-pay

## 2017-07-26 NOTE — Lactation Note (Signed)
This note was copied from a baby's chart.  07/26/2017  Name: Terri BirksWilliam Ryan Phillips MRN: [id number] Date of Birth: 06/18/2015 Gestational Age: Gestational Age: 4975w2d Birth Weight: 96.5 oz Weight today:   7 pounds 15 ounces (3600 grams) with clean newborn diaper.   Terri Phillips has gained 724 grams in the last 14 days with an average daily weight gain of 52 grams a day.   Mom is here with Terri Phillips for follow up feeding assessment.   Infant noted to have a labial frenulum that inserts at the bottom of the gum ridge.  Infant noted to have posterior lingual frenulum with limited tongue elevation and some limitations to tongue extension today. Infant lateralizes tongue well. Mom reports her nipples are painful with latch and infant tends to bite at the end of feedings. Mom reports her nipple is compressed post BF with some feedings but not all. Parents have talked about his tongue and are considering a consultation top have evaluated.   Mom reports she is BF using both breasts every 2 hours and then offers him a bottle of formula in a Tommie Tippee bottle and he takes 1.5 ounces post BF. He will leak from the bottle some at the ends of the feedings, no choking on the bottle. Infant is still receiving Neosure 22 calorie, Enc mom to ask Ped how long they need to continue on 22 calorie formula. Mom reports infant recently increased frequency of feedings, consistent with growth spurt.  Mom reports infant is stooling about every 4 days, has a lot of gas, and is spitting up a lot more recently.   Mom is not pumping, she felt it was too much for her to continue. Enc mom to add some pumping in if she is able to. She plans to ask her doctor about Fenugreek when she goes on Friday. Lactation Cookie recipe was given.   Mom latched infant to the left breast in the cradle hold, infant latched well and fed actively with intermittent swallows. Infant transferred 22 ml on the left breast. Infant was then latched to the right  breast to continue feeding, infant fed for about 15 minutes and transferred ml.   Infant to follow up with Ped at 2 months. Mom aware of OP Support groups. Mom to follow up with Lactation as needed or 1-2 days post tongue/lip revision.   Mom reports she has no other questions/concerns at this time.     General Information: Mother's reason for visit: Feeding up feeding assessment Consult: Follow-up Lactation consultant: Noralee StainSharon Tiandre Teall RN,IBCLC Breastfeeding experience: latching better, supplements after each BF   Maternal medications: Pre-natal vitamin, Other(Sertraline)  Breastfeeding History: Frequency of breast feeding: every 2 hours Duration of feeding: 20-30 minutes  Supplementation: Supplement method: bottle(Tommie Tippee) Brand: Similac(Neosure 22 cal. ) Formula volume: 45 ml Formula frequency: 10 x a day Total formula volume per day: 450 ml Breast milk volume: what he gets at the breast Breast milk frequency: every 2-3 hours   Pump type: Other(Medela Free Style) Pump frequency: 0    Infant Output Assessment: Voids per 24 hours: 8+ Urine color: Clear yellow Stools per 24 hours: every 4 days Stool color: Brown  Breast Assessment: Breast: Soft Nipple: Erect Pain level: 0 Pain interventions: Bra  Feeding Assessment:   Infant oral assessment comment: Infant noted to have a labial frenulum that inserts at the bottom of the gum ridge. Infant noted to have posterior lingual frenulum with limited tongue elevation and some limitations to tongue extension. Infant lateralizes  tongue well. Mom reports her nipples are painful with latch and infant tends to bite at the end of feedings. Mom reports her nipple is compressed post BF with some feedings but not all.  Positioning: Cross cradle(left breast) Latch: 2 - Grasps breast easily, tongue down, lips flanged, rhythmical sucking. Audible swallowing: 2 - Spontaneous and intermittent Type of nipple: 2 - Everted at rest and after  stimulation Comfort: 2 - Soft/non-tender Hold: 2 - No assistance needed to correctly position infant at breast LATCH score: 10 Latch assessment: Deep Lips flanged: Yes Suck assessment: Displays both   Pre-feed weight: 3600 grams Post feed weight: 3622 grams Amount transferred: 22 ml Amount supplemented: 0  Additional Feeding Assessment: Infant oral assessment: Variance Infant oral assessment comment: see above Positioning: Cross cradle(Right breast) Latch: 2 - Grasps breast easily, tongue down, lips flanged, rhythmical sucking. Audible swallowing: 2 - Spontaneous and intermittent Type of nipple: 2 - Everted at rest and after stimulation Comfort: 2 - Soft/non-tender Hold: 2 - No assistance needed to correctly position infant at breast LATCH score: 10 Latch assessment: Deep Lips flanged: Yes Suck assessment: Displays both   Pre-feed weight: 3622 grams Post feed weight: 3648 grams Amount transferred: 26 ml Amount supplemented: 45  Totals: Total amount transferred: 48 ml Total supplement given: 45 Total amount pumped post feed: 0   Plan: 1. Continue to offer the breast with each feeding as mom and infant desire 2. Continue to offer bottle of breast milk or formula after breast feeding 3. Terri Phillips needs 68-90 ml (2.3-3 ounces) for 8 feedings a day or 540 ml-720 ml (18-24 ounces) a day  4. Keep up the good work 5. Call for assistance as needed [phone] 6. Thank you for allowing me to assist you today 7. Follow up with Lactation as needed or 1-2 days post revisions if done   [facility] RN, IBCLC                                                     Terri Phillips 07/26/2017, 11:48 AM

## 2017-09-08 ENCOUNTER — Ambulatory Visit: Payer: Self-pay

## 2017-09-08 NOTE — Lactation Note (Signed)
This note was copied from a baby's chart. 09/08/2017  Name: Terri Phillips MRN: [id number] Date of Birth: 06/18/2015 Gestational Age: Gestational Age: [redacted]w[redacted]d Birth Weight: 96.5 oz Weight today:    13 pounds 1.6 ounces (5944 grams) with clean size 1 diaper  Terri Phillips has gained 2344 grams in the last 44 days with an average daily weight of 53 grams a day.   Infant self awakens to feed about every 3 hours. He will sleep for up to 5 hours at night. Mom does awaken him if he sleeps greater that 3.5 hours during the day. Enc mom to allow infant to feed on demand.   Mom started pumping again yesterday post BF. She is pumping for about 5 minutes and obtains 5-10 ml. She did pump once an hour after feeding infant and obtained 40 ml.   Infant post labial and lingual frenotomy on 5/24 by Dr. Lexine Baton.   Infant with granulation tissue to lip area, upper lip easily flanges with manual manipulation. Sucking blister noted to center of upper lip. Infant need lip flanging on the breast with each feeding. Mom denies pain with feeding but feels infant is latching better since procedure. Some clicking noted with feeding. Infant with drooling on the bottle since procedures. Mom and dad are performing lip and tongue stretches per Dr. Lexine Baton.   Mom asking about increasing breast milk supply. Reviewed continuing to pump post BF if mom able and to take with OB about taking supplements such as Fenugreek or [place]. Fenugreek handout given. Mom was told about Fenugreek previously and she said she did not pursue at that time. Talked about power pumping.   Suck training shown to mom and asked to perform 6-8 x a day while performing the tongue/lip stretches. Handout given.   Infant to follow up with Dr. Lexine Baton on 5/30. Infant to follow up with Pediatrician on 7/10. Infant to follow up with Lactation as needed. Mom to call with questions and concerns as needed.    General Information: Mother's reason for visit: Follow up  post Lingual and labial Frenotomies on 5/23 by Dr. Lexine Baton Consult: Follow-up Lactation consultant: Terri Stain RN,IBCLC Breastfeeding experience: latching better, still supplements post BF   Maternal medications: Pre-natal vitamin, Other(Sertraline)  Breastfeeding History: Frequency of breast feeding: every 3 hours Duration of feeding: 20 minutes  Supplementation: Supplement method: bottle(Tommie Tippee) Brand: Similac Formula volume: 2.5-3 oz Formula frequency: 7 x a day Total formula volume per day: 540 ml Breast milk volume: when BF Breast milk frequency: every 3-5 hours   Pump type: Other(Medela Free Style) Pump frequency: every 3-5 hours Pump volume: 10-40 ml  Infant Output Assessment: Voids per 24 hours: 8-10 Urine color: Clear yellow Stools per 24 hours: 2  Stool color: Brown  Breast Assessment: Breast: Soft Nipple: Erect Pain level: 0 Pain interventions: Bra  Feeding Assessment: Infant oral assessment: Variance Infant oral assessment comment: Infant with granulation tissue to lip area, upper lip easily flanges with manual manipulation. Sucking blister noted to center of upper lip. Infant need lip flanging on the breast with each feeding. Mom denies pain with feeding but feels infant is latching better since procedure. Some clicking noted with feeding. Infant with drooling on the bottle since procedures Positioning: Cradle(Left breast) Latch: 2 - Grasps breast easily, tongue down, lips flanged, rhythmical sucking. Audible swallowing: 2 - Spontaneous and intermittent Type of nipple: 2 - Everted at rest and after stimulation Comfort: 2 - Soft/non-tender Hold: 2 - No assistance needed to correctly  position infant at breast LATCH score: 10 Latch assessment: Deep Lips flanged: Yes Suck assessment: Displays both   Pre-feed weight: 5944 grams Post feed weight: 5996 grams Amount transferred: 52 ml Amount supplemented: 0  Additional Feeding Assessment: Infant oral  assessment: Variance Infant oral assessment comment: see above Positioning: Cradle(right breast) Latch: 1 - Repeated attempts neede to sustain latch, nipple held in mouth throughout feeding, stimulation needed to elicit sucking reflex. Audible swallowing: 1 - A few with stimulation Type of nipple: 2 - Everted at rest and after stimulation Comfort: 2 - Soft/non-tender Hold: 2 - No assistance needed to correctly position infant at breast LATCH score: 8 Latch assessment: Deep Lips flanged: Yes Suck assessment: Displays both   Pre-feed weight: 5996 grams Post feed weight: 6004 grams Amount transferred: 8 ml    Totals: Total amount transferred: 60 ml Total supplement given: 90 ml Total amount pumped post feed: 0  1. Offer breast with feeding cues 2. Offer infant bottle post breast feeding if he is still cueing to feed 3. Continue stretches per Dr. Lexine Baton 4. Suck training exercises 6-8 x a day before or after feeding 5. Continue tummy time as you have been 6. Can continue to pump as you have been post BF to stimulate milk production 7. Consider power pumping once a day (pump for 20 minutes, rest for 20 minutes, pump for 10 minutes, rest for 10 minutes and pump for 10 minutes.  8. Terri Phillips needs 111 ml-148 ml (3.7 -5 ounces) for 8 feedings a day or 885 ml-1180 ml (30-39 ounces) a day.  9. Keep up the work 10. Thank you for allowing me to assist you today 11. Please call with any questions/concerns as needed [phone] 12. Follow up with Lactation as needed   Terri Blalock RN, IBCLC                                                         Terri Phillips Terri Phillips 09/08/2017, 10:42 AM

## 2018-11-02 ENCOUNTER — Other Ambulatory Visit: Payer: Self-pay | Admitting: Obstetrics and Gynecology

## 2018-11-02 DIAGNOSIS — R928 Other abnormal and inconclusive findings on diagnostic imaging of breast: Secondary | ICD-10-CM

## 2018-11-06 ENCOUNTER — Ambulatory Visit
Admission: RE | Admit: 2018-11-06 | Discharge: 2018-11-06 | Disposition: A | Discharge status: Home / Self Care | Payer: Commercial Managed Care - PPO | Source: Ambulatory Visit | Attending: Obstetrics and Gynecology | Admitting: Obstetrics and Gynecology

## 2018-11-06 ENCOUNTER — Other Ambulatory Visit: Payer: Self-pay

## 2018-11-06 ENCOUNTER — Ambulatory Visit: Payer: Commercial Managed Care - PPO

## 2018-11-06 DIAGNOSIS — R928 Other abnormal and inconclusive findings on diagnostic imaging of breast: Secondary | ICD-10-CM

## 2018-11-06 IMAGING — MG DIGITAL DIAGNOSTIC UNILATERAL LEFT MAMMOGRAM WITH TOMO AND CAD
4 series · 4 of 12 positions shown · non-contrast
Comparison: Previous exam(s).

CLINICAL DATA: 33-year-old female recalled from screening mammogram
dated [DATE] for a possible left breast asymmetry. The patient
has a strong family history of breast cancer, with her mother
diagnosed at age 36 who is BRCA positive. The patient has undergone
genetic testing, but is awaiting her results.

EXAM:
DIGITAL DIAGNOSTIC UNILATERAL LEFT MAMMOGRAM WITH CAD AND TOMO

[L CC synth-2D]
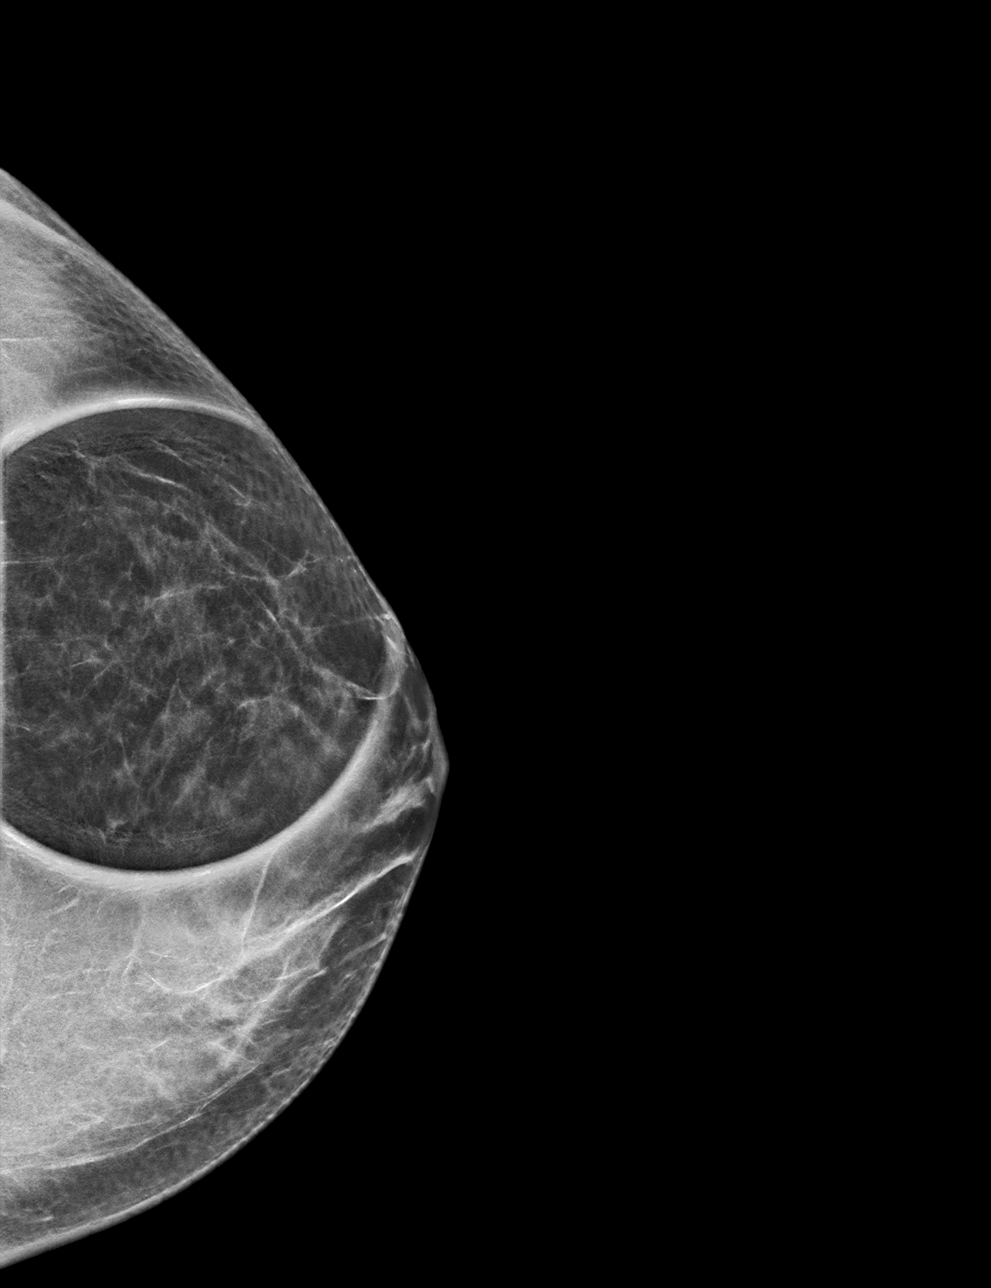

[L ML synth-2D]
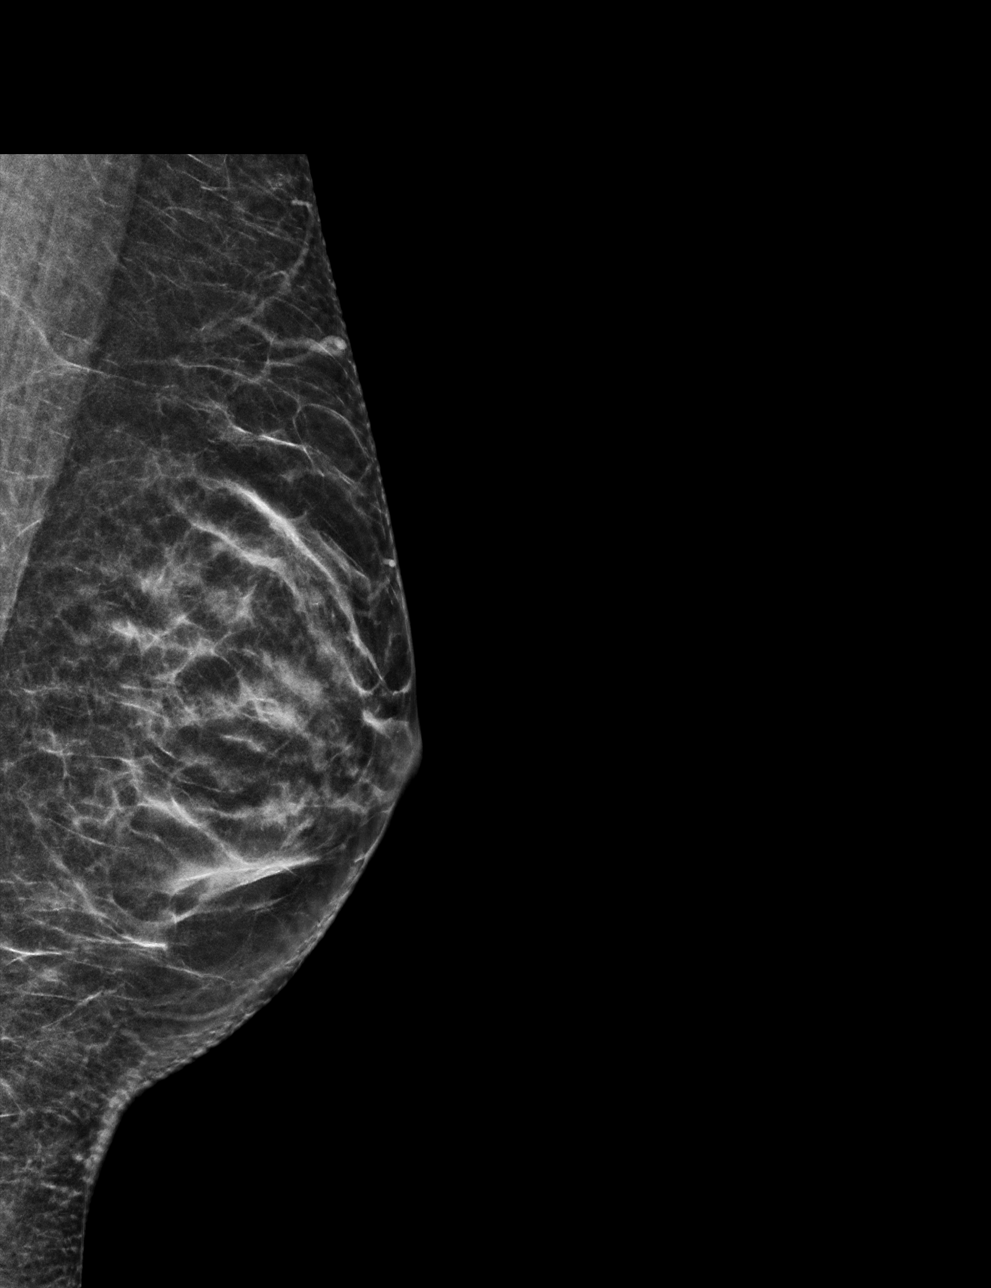

[L ML tomo · tomo slice 27/52.0]
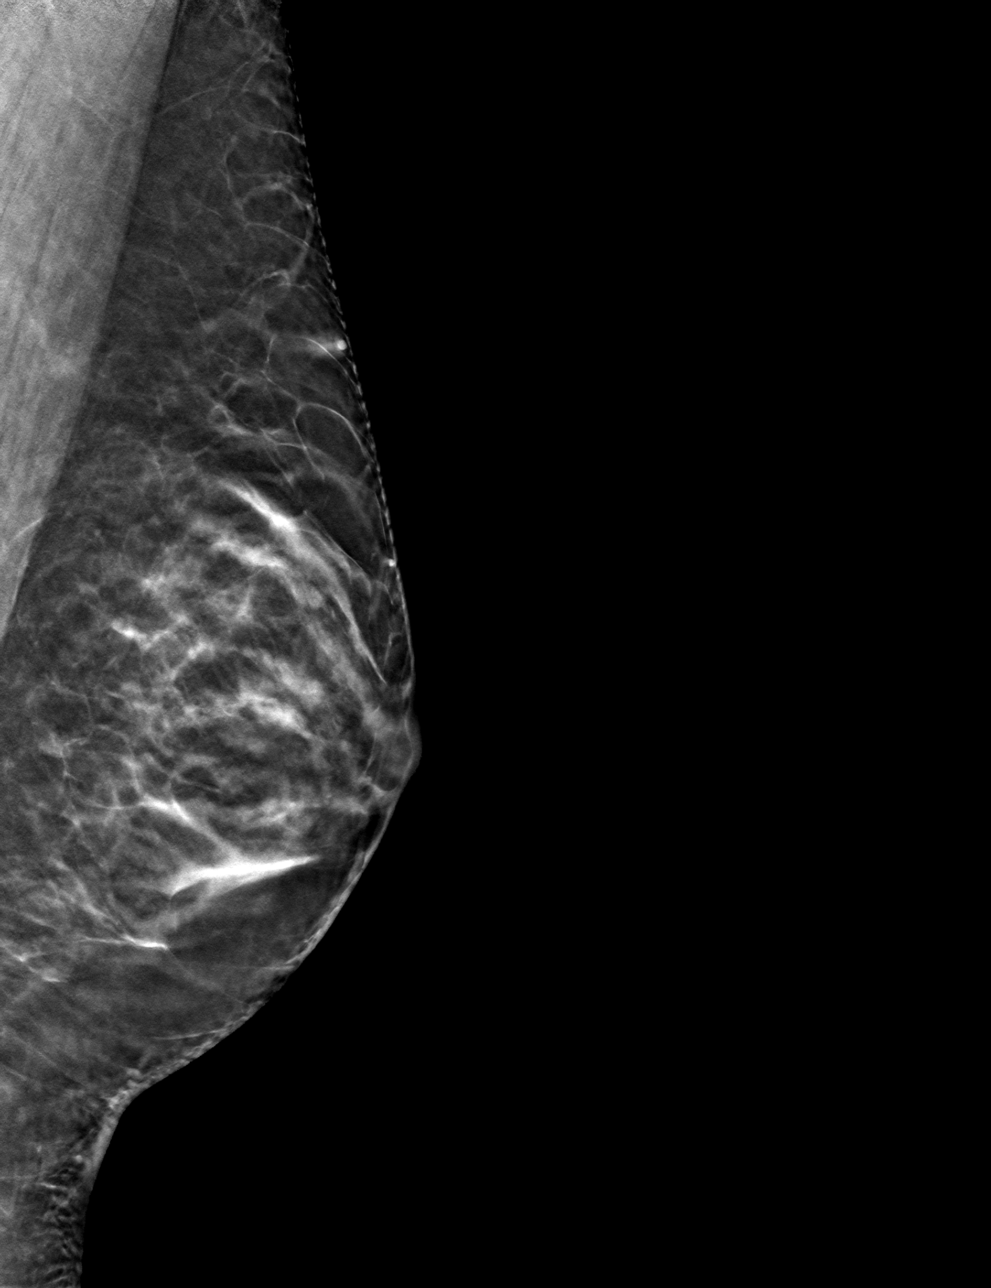

[L CC tomo · tomo slice 23/46.0]
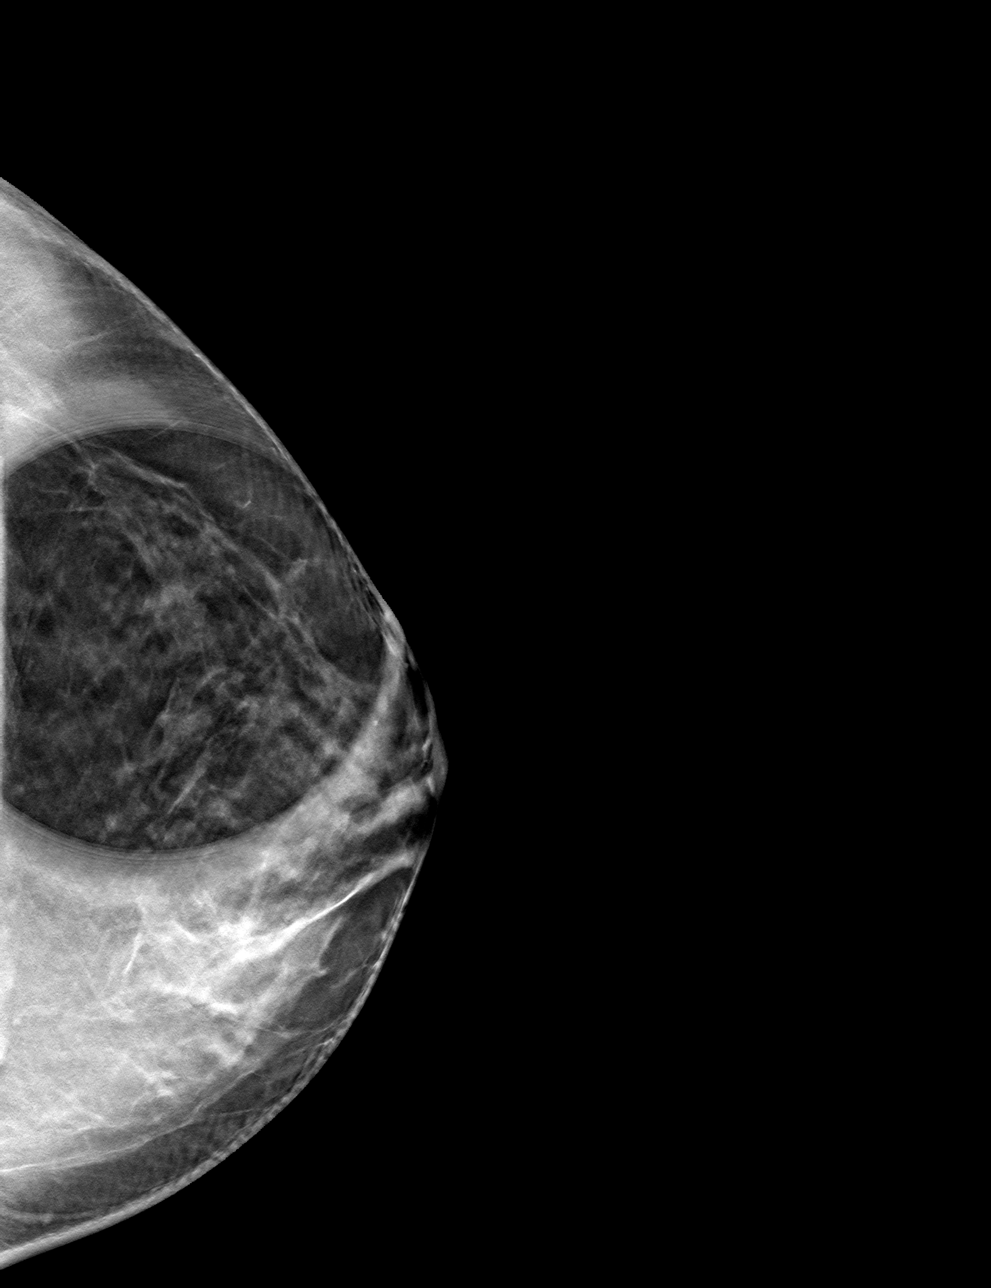

[4 of 12 positions shown; findings below may reference images not displayed]

ACR Breast Density Category c: The breast tissue is heterogeneously
dense, which may obscure small masses.
FINDINGS: Previously described, possible asymmetry in the slightly lateral
left breast at posterior depth is seen on the CC projection resolves
into well dispersed fibroglandular tissue on today's additional
views. No suspicious findings are identified.

Mammographic images were processed with CAD.
IMPRESSION: No mammographic evidence of malignancy.

RECOMMENDATION:
1.  Screening mammogram in one year.(Code:[OL])
2. The [employer] Society recommends annual MRI and mammography
in patients with an estimated lifetime risk of developing breast
cancer greater than 20 - 25%, or who are known or suspected to be
positive for the breast cancer gene.

I have discussed the findings and recommendations with the patient.
Results were also provided in writing at the conclusion of the
visit. If applicable, a reminder letter will be sent to the patient
regarding the next appointment.

BI-RADS CATEGORY  1: Negative.

## 2019-01-01 ENCOUNTER — Telehealth: Payer: Self-pay | Admitting: Licensed Clinical Social Worker

## 2019-01-01 NOTE — Telephone Encounter (Signed)
A genetic counseling appt has been scheduled for Terri Phillips to see Faith Rogue on 9/21 at 2pm. Pt has been made aware to arrive 15 minutes early.

## 2019-01-04 ENCOUNTER — Telehealth: Payer: Self-pay | Admitting: Licensed Clinical Social Worker

## 2019-01-04 NOTE — Telephone Encounter (Signed)
Called patient regarding upcoming [facility] appointment, per patient's this will be a walk--in visit.

## 2019-01-07 ENCOUNTER — Encounter: Payer: Self-pay | Admitting: Licensed Clinical Social Worker

## 2019-01-07 ENCOUNTER — Inpatient Hospital Stay: Payer: Commercial Managed Care - PPO | Attending: Genetic Counselor | Admitting: Licensed Clinical Social Worker

## 2019-01-07 ENCOUNTER — Inpatient Hospital Stay: Payer: Commercial Managed Care - PPO

## 2019-01-07 ENCOUNTER — Other Ambulatory Visit: Payer: Self-pay

## 2019-01-07 DIAGNOSIS — Z1509 Genetic susceptibility to other malignant neoplasm: Secondary | ICD-10-CM | POA: Diagnosis not present

## 2019-01-07 DIAGNOSIS — Z8481 Family history of carrier of genetic disease: Secondary | ICD-10-CM

## 2019-01-07 DIAGNOSIS — Z803 Family history of malignant neoplasm of breast: Secondary | ICD-10-CM | POA: Diagnosis not present

## 2019-01-07 DIAGNOSIS — Z1501 Genetic susceptibility to malignant neoplasm of breast: Secondary | ICD-10-CM | POA: Insufficient documentation

## 2019-01-07 NOTE — Progress Notes (Addendum)
REFERRING PROVIDER: Cheri Fowler, MD [address], [address] [place],  [place] [address]  PRIMARY PROVIDER:  Ardith Dark, PA-C  PRIMARY REASON FOR VISIT:  1. Family history of BRCA1 gene positive   2. BRCA1 gene mutation positive   3. Family history of breast cancer      HISTORY OF PRESENT ILLNESS:   Terri Phillips, a 33 y.o. female, was seen for a [place] cancer genetics consultation at the request of Dr. Willis Modena due to her recent genetic testing that revealed the known familial mutation in [place].  Terri Phillips presents to clinic today to discuss the possibility of a hereditary predisposition to cancer, genetic testing, and to further clarify her future cancer risks, as well as potential cancer risks for family members.    Terri Phillips is a 33 y.o. female with no personal history of cancer.    CANCER HISTORY:  Oncology History   No history exists.     RISK FACTORS:  Menarche was at age 68.  First live birth at age 76.  Ovaries intact: yes.  Hysterectomy: no.  Menopausal status: premenopausal.  HRT use: 0 years. Colonoscopy: no; not examined. Mammogram within the last year: yes. Number of breast biopsies: 0. Up to date with pelvic exams: yes. Any excessive radiation exposure in the past: no  Past Medical History:  Diagnosis Date   Anxiety    BRCA1 gene mutation positive    Family history of BRCA1 gene positive    Family history of breast cancer     Past Surgical History:  Procedure Laterality Date   WISDOM TOOTH EXTRACTION      Social History   Socioeconomic History   Marital status: Married    Spouse name: Not on file   Number of children: Not on file   Years of education: Not on file   Highest education level: Not on file  Occupational History   Not on file  Social Needs   Financial resource strain: Not on file   Food insecurity    Worry: Not on file    Inability: Not on file   Transportation needs    Medical: Not on file    Non-medical: Not on file  Tobacco Use   Smoking status: Never Smoker   Smokeless tobacco: Never Used  Substance and Sexual Activity   Alcohol use: No    Frequency: Never   Drug use: No   Sexual activity: Not on file  Lifestyle   Physical activity    Days per week: Not on file    Minutes per session: Not on file   Stress: Not on file  Relationships   Social connections    Talks on phone: Not on file    Gets together: Not on file    Attends religious service: Not on file    Active member of club or organization: Not on file    Attends meetings of clubs or organizations: Not on file    Relationship status: Not on file  Other Topics Concern   Not on file  Social History Narrative   Not on file     FAMILY HISTORY:  We obtained a detailed, 4-generation family history.  Significant diagnoses are listed below: Family History  Problem Relation Age of Onset   Cancer Mother    Breast cancer Mother 24   Breast cancer Maternal Aunt 15   Breast cancer Maternal Grandmother 62   Breast cancer Cousin 72   Terri Phillips has one son, 25  months old. She has one sister, 34, who reportedly tested negative for the known familial BRCA1 mutation.   Terri Phillips mother was diagnosed with breast cancer at 90 and had genetic testing that revealed a BRCA1 mutation. Patient's mother is living at 34. Patient has 2 maternal aunts, 1 maternal uncle. One of her aunts was diagnosed with breast cancer at 57 and also tested positive for the BRCA1 mutation. This aunt had a daughter with breast cancer at 96, also tested positive for the BRCA1 mutation. Patient knows her other aunt was tested for BRCA1 mutation but is unsure of results, her uncle was not tested. Terri Phillips maternal grandmother was diagnosed with breast cancer at 8 and died at 75.   Terri Phillips father is living, no history of cancer. Patient has 2 paternal uncles, 1 paternal aunt, no cancers. No known cancers in paternal cousins.  No known cancers in paternal grandparents.  Terri Phillips is aware of previous family history of genetic testing for hereditary cancer risks. Patient's maternal ancestors are of [place] descent, and paternal ancestors are of [place] descent. There is no reported Ashkenazi Jewish ancestry. There is no known consanguinity.  GENETIC COUNSELING ASSESSMENT: Terri Phillips is a 33 y.o. female with a BRCA1 mutation. We, therefore, discussed and recommended the following at today's visit.   DISCUSSION:   GENETIC TEST RESULTS: Genetic testing was ordered through her OB GYN office and reported out on 11/29/2018 through the [facility] cancer panel found a pathogenic variant in BRCA1 called [id number]>C.This panel analyzed the following 32 genes: APC, ATM, BARD1, BMPR1A, BRCA1, BRCA2, BRIP1, CDH1, CDK4, CDKN2A, CHEK2, DICER1, EPCAM, FANCC, GREM1, MLH1, MSH2, MSH6, MRE11, MUTYH, NBN, PABL2,PMS2, POLD1, POLE2, PTEN, RAD51C, RAD51D, SMAD4, SMARCA4, STK11 and TP53. The test report has been scanned into EPIC and is located under the Molecular Pathology section of the Results Review tab.  A portion of the result report is included below for reference.        BRCA1   We discussed the cancers, inheritance, management asscociated with BRCA1, and the importance of telling family members about this result.   HBOC syndrome is characterized by an increased lifetime risk for generally adult-onset cancers including breast, contralateral breast, female breast, ovarian, prostate and pancreatic.   Cancers associated with BRCA1: -Breast cancer, up to 87% risk -Ovarian cancer, up to 54% risk -Pancreatic cancer, 1-3% risk -Prostate cancer, elevated -Preliminary evidence for association with melanoma   Inheritance Hereditary predisposition to cancer due to pathogenic variants in the BRCA1 gene has autosomal dominant inheritance. This means that an individual with a pathogenic variant has a 50% chance of passing the condition on to  his/her offspring. Most cases are inherited from a parent, but some cases may occur spontaneously (i.e., an individual with a pathogenic variant has parents who do not have it). Identification of a pathogenic variant allows for the recognition of at-risk relatives who can pursue testing for the familial variant.   Management:    Breast cancer -clinical breast exams every 6-12 months beginning at 91 -age 46-29: annual breast MRI screening with contrast -age 87-75: annual mammogram with consideration of tomosynthesis and breast MRI screening with contrast -For women treated for breast cancer, screening of remaining breast tissue with annual mammography and breast MRI should continue (if they have not had bilateral mastectomy) -Consider option of risk-reducing mastectomy   Ovarian cancer -Recommend risk-reducing salpingo-oopherectomy (RRSO) typically between the ages of 5-40 and upon completion of childbearing -For patients who do not elect RRSO,  transvaginal ultrasound combined with serum CA-125 for ovarian cancer screening has not been shown to be sufficiently sensitive or specific as to support a positive recommendation, but, although of uncertain benefit, may be considered at clinician's discretion starting at age 29-35 years   Pancreatic cancer -For individuals with a pathogenic/likely pathogenic variant in one of the pancreatic cancer susceptibility genes: -Consider pancreatic cancer screening beginning at age 4 (or 72 years younger than earliest exocrine pancreatic cancer diagnosis in the family, whichever is earlier), for individuals with exocrine pancreatic cancer in 1 or more first or second degree relatives from the same side of (or presumed to be from same side of) family as the identified pathogenic/likely pathogenic variant -The panel does not currently recommend pancreatic cancer screening for carrier of mutations in genes other than STK11 and CDKN2A in the absence of a close family  history of exocrine pancreatic cancer  -Terri Phillips did not report a family history of pancreatic cancer  Melanoma -There are no specific NCCN guidelines regarding melanoma screening for individuals with BRCA mutations.  -However, sun protection is recommended, and routine skin exams by a dermatologist can be considered.     These guidelines are based on current NCCN guidelines (NCCN v.1.2021).  These guidelines are subject to change and continually updated and should be directly referenced for future medical management.      Risk Reduction:   Chemoprevention can reduce the risk of breast cancer in the contralateral breast in women with BRCA1 and BRCA2 mutations who have been diagnosed with breast cancer (PMID: 9562130, 86578469).  Oral contraceptive use has been shown to reduce the risk of ovarian cancer by approximately 60% in BRCA mutation carriers if taken for at least 5 years (PMID: 6295284).  An individuals cancer risk and medical management are not determined by genetic test results alone. Overall cancer risk assessment incorporates additional factors including personal medical history, family history, as well as available genetic information that may result in a personalized plan for cancer prevention and surveillance.  FAMILY MEMBERS: It is important that all of Terri Phillips relatives (both men and women) know of the presence of this gene mutation. Site-specific genetic testing can sort out who in the family is at risk and who is not.    Terri Phillips child has a 50% chance to have inherited this mutation. However, they are  young and this will not be of any consequence to them for several years. We do not test children because there is no risk to them until they are adults. We recommend they have genetic counseling and testing by the time they are in their early 20s.    PLAN:   1. Terri Phillips would like to be referred to the high risk breast clinic here at the cancer center for  further discussion of her care and long term follow up. She would like to discuss breast MRIs and is not currently interested in double mastectomy. She is currently family planning and would like to have more children before having RRSO.    2. Terri Phillips plans to discuss these results with her family and will reach out to [place] if we can be of any assistance in coordinating genetic testing for any of her relatives. Most of her relatives have already been tested.    SUPPORT AND RESOURCES: If Terri Phillips is interested in BRCA-specific information and support, there are two groups, Facing Our Risk (www.facingourrisk.com) and Bright Pink (www.brightpink.org) which some people have found useful. They provide opportunities  to speak with other individuals from high-risk families. To locate genetic counselors in other cities, visit the website of the [employer] ([employer]ist[employer]) and Secretary/administrator for a Social worker by zip code.  We encouraged Terri Phillips to remain in contact with [place] on an annual basis so we can update her personal and family histories, and let her know of advances in cancer genetics that may benefit the family. Our contact number was provided. Terri Phillips questions were answered to her satisfaction today, and she knows she is welcome to call anytime with additional questions.   Faith Rogue, MS Genetic Counselor [email or web address] Phone: [phone]  _______________________________________________________________________ For Office Staff:  Number of people involved in session: 1 Was an Intern/ student involved with case: no

## 2019-01-09 ENCOUNTER — Telehealth: Payer: Self-pay | Admitting: Oncology

## 2019-01-09 NOTE — Telephone Encounter (Signed)
A new high risk appt has been scheduled for Terri Phillips to see Dr. Jana Hakim on 10/12 at 4:30pm. She's been made aware to arrive at least 15 minutes early.

## 2019-01-27 NOTE — Progress Notes (Signed)
[place]  [phone] [phone]) [phone]    ID: Terri Phillips DOB: [birth year]  MR#: [id number]  TXM#:[id number]  Patient Care Team: Ardith Dark, Hershal Coria as PCP - General (Physician Assistant) , Virgie Dad, MD as Consulting Physician (Medical Oncology) Meisinger, Sherren Mocha, MD as Consulting Physician (Obstetrics and Gynecology) OTHER MD:   CHIEF COMPLAINT: Breast and ovarian cancer risk/BRCA1 positivity  CURRENT TREATMENT: Intensified screening   HISTORY OF CURRENT ILLNESS: Terri Phillips was referred to [place] by Dr. Cheri Fowler at [facility] for her BRCA1 positivity and strong family history of breast cancer and ovarian cancer  Terri Phillips underwent genetic testing ordered by Dr Willis Modena on 11/03/2018 through the [place] MDx cancer panel. 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  She underwent a digital diagnostic unilateral left mammogram with tomography on 11/06/2018 for possible left breast asymmetry showing: Breast Density Category C. There is no mammographic evidence of malignancy.   The patient's subsequent history is as detailed below.   INTERVAL HISTORY: Gene was evaluated in the high risk cancer clinic on 01/28/2019.    REVIEW OF SYSTEMS: Terri Phillips denies unusual headaches, visual changes, nausea, vomiting, stiff neck, dizziness, or gait imbalance. There has been no cough, phlegm production, or pleurisy, no chest pain or pressure, and no change in bowel or bladder habits. The patient denies fever, rash, bleeding, unexplained fatigue or unexplained weight loss. A detailed review of systems was otherwise entirely negative.    PAST MEDICAL HISTORY: Past  Medical History:  Diagnosis Date  . Anxiety   . BRCA1 gene mutation positive   . Family history of BRCA1 gene positive   . Family history of breast cancer      PAST SURGICAL HISTORY: Past Surgical History:  Procedure Laterality Date  . WISDOM TOOTH EXTRACTION       FAMILY HISTORY: Family History  Problem Relation Age of Onset  . Cancer Mother   . Breast cancer Mother 46  . Breast cancer Maternal Aunt 88  . Breast cancer Maternal Grandmother 49  . Breast cancer Cousin 26   Terri Phillips's father is living as of October 2020 with no history of cancer.. Patients' mother is 60 years old as of October 2020.  She was diagnosed with breast cancer at age 3.  She carries a BRCA1 mutation.  She has undergone prophylactic bilateral salpingo-oophorectomy.  1 maternal aunt was diagnosed with breast cancer at age 20.  This and is known to be BRCA1 positive.  She had a daughter with breast cancer at age 87, also positive for the BRCA1 mutation.  The second maternal aunt is not known to have had cancer.  The patient does not know whether she has been tested for the mutation or not.  There is also a maternal uncle without cancer.  He has not been tested.  The maternal grandmother was diagnosed with breast cancer and ovarian cancer at ages 85 and 55 and died at age 66. On the paternal side there are 2 uncles and 1 aunt with no history of cancers.  There is no history of cancer in the paternal grandparents.     GYNECOLOGIC HISTORY:  No LMP recorded. (Menstrual status: IUD). 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 Her husband, Terri Phillips, is an Chief Financial Officer at [employer].  There is son Terri Phillips is 46 months old as of October 2020.  The patient attends [employer].   ADVANCED DIRECTIVES: In the absence of  any documents to the contrary the patient's husband is her healthcare power of attorney  HEALTH MAINTENANCE: Social History   Tobacco Use  . Smoking status: Never Smoker  . Smokeless tobacco: Never Used  Substance Use Topics  . Alcohol use: No    Frequency: Never  . Drug use: No    Colonoscopy: n/a  PAP: UTD/Meisinger  Bone density: n/a   No Known Allergies  Current Outpatient Medications  Medication Sig Dispense Refill  . Prenatal Vit-Fe Fumarate-FA (PRENATAL VITAMIN) 27-0.8 MG TABS Take by mouth.    . sertraline (ZOLOFT) 100 MG tablet Take 100 mg by mouth at bedtime.      No current facility-administered medications for this visit.      OBJECTIVE: Young white woman in no acute distress  Vitals:   01/28/19 1631  BP: 129/78  Pulse: 64  Resp: 20  Temp: 98.2 F (36.8 C)  SpO2: 99%   Wt Readings from Last 3 Encounters:  01/28/19 159 lb 4.8 oz (72.3 kg)  06/18/17 176 lb (79.8 kg)   Body mass index is 27.34 kg/m.    ECOG FS:0 - Asymptomatic  Ocular: Sclerae unicteric, pupils round and equal Ear-nose-throat: Oropharynx clear and moist Lymphatic: No cervical or supraclavicular adenopathy Lungs no rales or rhonchi Heart regular rate and rhythm Abd soft, nontender, positive bowel sounds MSK no focal spinal tenderness, no joint edema Neuro: non-focal, well-oriented, appropriate affect Breasts: No skin or nipple changes of concern.  There are no palpable masses of concern.  Both axillae are benign.   LAB RESULTS:  CMP  No results found for: NA, K, CL, CO2, GLUCOSE, BUN, CREATININE, CALCIUM, PROT, ALBUMIN, AST, ALT, ALKPHOS, BILITOT, GFRNONAA, GFRAA  No results found for: TOTALPROTELP, ALBUMINELP, A1GS, A2GS, BETS, BETA2SER, GAMS, MSPIKE, SPEI  No results found for: KPAFRELGTCHN, LAMBDASER, KAPLAMBRATIO  Lab Results  Component Value Date   WBC 13.5 (H) 06/18/2017   HGB 11.7 (L) 06/18/2017   HCT 34.4 (L) 06/18/2017   MCV 90.5 06/18/2017   PLT 195  06/18/2017    No results found for: LABCA2  No components found for: MYTRZN356  No results for input(s): INR in the last 168 hours.  No results found for: LABCA2  No results found for: POL410  No results found for: VUD314  No results found for: HOO875  No results found for: CA2729  No components found for: HGQUANT  No results found for: CEA1 / No results found for: CEA1   No results found for: AFPTUMOR  No results found for: CHROMOGRNA  No results found for: PSA1  No visits with results within 3 Day(s) from this visit.  Latest known visit with results is:  Admission on 06/17/2017, Discharged on 06/20/2017  Component Date Value Ref Range Status  . POCT [place] Test 06/17/2017 Positive = ruptured amniotic membanes   Final  . Group B strep by PCR 06/17/2017 POSITIVE* NEGATIVE Final   Comment: CRITICAL RESULT CALLED TO, READ BACK BY AND VERIFIED WITH: K WHEATLEY 06/18/17 AT 0124 BY [facility] Performed at [facility], [address], [place], [place] [address]   . WBC 06/18/2017 13.5*  4.0 - 10.5 K/uL Final  . RBC 06/18/2017 3.80* 3.87 - 5.11 MIL/uL Final  . Hemoglobin 06/18/2017 11.7* 12.0 - 15.0 g/dL Final  . HCT 06/18/2017 34.4* 36.0 - 46.0 % Final  . MCV 06/18/2017 90.5  78.0 - 100.0 fL Final  . MCH 06/18/2017 30.8  26.0 - 34.0 pg Final  . MCHC 06/18/2017 34.0  30.0 - 36.0 g/dL Final  . RDW 06/18/2017 13.9  11.5 - 15.5 % Final  . Platelets 06/18/2017 195  150 - 400 K/uL Final   Performed at [facility], [address] [place], [place] [address]  . ABO/RH(D) 06/18/2017 O NEG   Final  . Antibody Screen 06/18/2017 POS   Final  . Sample Expiration 06/18/2017 06/21/2017   Final  . Antibody Identification 60/45/4098 PASSIVELY ACQUIRED ANTI-D   Final  . Unit Number 06/18/2017 [id number]   Final  . Blood Component Type 06/18/2017 RED CELLS,LR   Final  . Unit division 06/18/2017 00   Final  . Status of Unit 06/18/2017 REL FROM [facility]   Final  . Transfusion  Status 06/18/2017 OK TO TRANSFUSE   Final  . Crossmatch Result 06/18/2017 COMPATIBLE   Final  . Unit Number 06/18/2017 [id number]   Final  . Blood Component Type 06/18/2017 RED CELLS,LR   Final  . Unit division 06/18/2017 00   Final  . Status of Unit 06/18/2017 REL FROM [facility]   Final  . Transfusion Status 06/18/2017 OK TO TRANSFUSE   Final  . Crossmatch Result 06/18/2017 COMPATIBLE   Final  . RPR Ser Ql 06/18/2017 Non Reactive  Non Reactive Final   Comment: (NOTE) Performed At: [facility] [place], [place] [id number] Rush Farmer MD WN:[id number] Performed at [facility], [address], [place], [place] [address]   . Blood Product Unit Number 06/18/2017 [id number]   Final  . Unit Type and Rh 06/18/2017 9500   Final  . Blood Product Expiration Date 06/18/2017 [id number]   Final  . Blood Product Unit Number 06/18/2017 [id number]   Final  . Unit Type and Rh 06/18/2017 9500   Final  . Blood Product Expiration Date 06/18/2017 [id number]   Final    (this displays the last labs from the last 3 days)  No results found for: TOTALPROTELP, ALBUMINELP, A1GS, A2GS, BETS, BETA2SER, GAMS, MSPIKE, SPEI (this displays SPEP labs)  No results found for: KPAFRELGTCHN, LAMBDASER, KAPLAMBRATIO (kappa/lambda light chains)  No results found for: HGBA, HGBA2QUANT, HGBFQUANT, HGBSQUAN (Hemoglobinopathy evaluation)   No results found for: LDH  No results found for: IRON, TIBC, IRONPCTSAT (Iron and TIBC)  No results found for: FERRITIN  Urinalysis No results found for: COLORURINE, APPEARANCEUR, LABSPEC, PHURINE, GLUCOSEU, HGBUR, BILIRUBINUR, KETONESUR, PROTEINUR, UROBILINOGEN, NITRITE, LEUKOCYTESUR   STUDIES:  Mammography report reviewed with the patient  ELIGIBLE FOR AVAILABLE RESEARCH PROTOCOL: no   ASSESSMENT: 33 y.o. , [place] woman at high risk for breast and ovarian cancer due to a pathogenic mutation in the BRCA1 gene.  (1) genetic  testing 11/03/2018 through the [facility] MDx cancer panel found a pathogenic variant in BRCA1 called c.5152+1G>C.  (a) there were no additional mutations noted in APC, ATM, BARD1, BMPR1A, BRCA1, BRCA2, BRIP1, CDH1, CDK4, CDKN2A, CHEK2, DICER1, EPCAM, FANCC, GREM1, MLH1, MSH2, MSH6, MRE11, MUTYH, NBN, PABL2,PMS2, POLD1, POLE2, PTEN, RAD51C, RAD51D, SMAD4, SMARCA4, STK11 and TP53.  (2) breast cancer risk: Intensified screening  (a) breast MRI every October  (b) breast mammography every April  (c) biannual MD breast exam   (3) ovarian  cancer risk: no effective screening available  (a) plan bilateral salpingo-oophorectomy as soon as family planning complete  (4) pancreatic cancer risk: no screening available   PLAN: I spent approximately 60 minutes face to face with Karthika with more than 50% of that time spent in counseling and coordination of care. Specifically we reviewed the biology of the patient's diagnosis and the specifics of her situation.  We discussed the way the DNA is transcribed to RNA and translated into protein, and the fact that the BRCA 1 and 2 proteins are specifically enzymes, and that they are involved in DNA repair.  Patients who have a mutation in 1 or both of the BRCA gene's are more likely to have mistakes occur when a cell duplicates its DNA.  This explains the increased risk of cancer in these patients although it does not explain why the risk is primarily limited to breast ovarian and pancreatic and prostate cancers.  We discussed the fact that there is no effective screening for pancreatic cancer.  The reason is that there is no barrier between cells sloughed off from the surface of the pancreas and the peritoneal fluid where the cells will float away to other parts of the abdomen.  By the time a mass can be found in the pancreas the likelihood of the cancer having already spread is very high  The situation is similar for ovarian cancer.  There is no barrier between the  surface of the ovary, where the cancer cells slough off, and the remaining of the abdomen. The cancer cells will float and attach to other surfaces and grow there.  By the time cancer is found in the ovary by ultrasound for example the chance of stage III disease is very high.  Oral contraceptives can somewhat reduce the risk of ovarian cancer.  However Stashia is very interested in growing her family and in fact they stopped using contraception in July.  I would think by next July they will have a 70 to 80% chance of having another child on the way.  In contrast to pancreatic and ovarian cancer we have well-defined and effective screening for breast cancer.  Devera will be able to keep her breasts if she chooses to under cover of intensified screening.  I am going to set her up for her MRI of the breast later this month, right after herperiod, since I would not want to be using contrast when she is pregnant.  I am setting her up for mammography in April, but if she is pregnant then we will simply do ultrasonography at that time.  My understanding is that her maternal grandmother died from ovarian cancer at age 19.  We generally like to do bilateral salpingo-oophorectomy 10 years before the earliest case.  Obviously that is not what we are going to do here but certainly once Mareesa is done with family planning and nursing I would recommend proceeding to bilateral salpingo-oophorectomy.  Given her very young age we would use estrogen replacement even though this would increase the risk of breast cancer by a small additional percent.  Keianna has a good understanding of the overall plan. She agrees with it. She knows the goal of treatment in her case is cure. She will call with any problems that may develop before her next visit here.    , Virgie Dad, MD  01/28/19 5:54 PM Medical Oncology and Hematology [facility] [place], [place] [address] Tel. [phone]  Fax. [phone]   I, Jacqualyn Posey am acting as a Education administrator for Chauncey Cruel, MD.   I, Lurline Del MD, have reviewed the above documentation for accuracy and completeness, and I agree with the above.

## 2019-01-28 ENCOUNTER — Other Ambulatory Visit: Payer: Self-pay

## 2019-01-28 ENCOUNTER — Inpatient Hospital Stay: Payer: Commercial Managed Care - PPO | Attending: Genetic Counselor | Admitting: Oncology

## 2019-01-28 VITALS — BP 129/78 | HR 64 | Temp 98.2°F | Resp 20 | Ht 64.0 in | Wt 159.3 lb

## 2019-01-28 DIAGNOSIS — Z79899 Other long term (current) drug therapy: Secondary | ICD-10-CM | POA: Insufficient documentation

## 2019-01-28 DIAGNOSIS — Z809 Family history of malignant neoplasm, unspecified: Secondary | ICD-10-CM | POA: Insufficient documentation

## 2019-01-28 DIAGNOSIS — Z1501 Genetic susceptibility to malignant neoplasm of breast: Secondary | ICD-10-CM | POA: Diagnosis present

## 2019-01-28 DIAGNOSIS — F419 Anxiety disorder, unspecified: Secondary | ICD-10-CM | POA: Diagnosis not present

## 2019-01-28 DIAGNOSIS — Z8041 Family history of malignant neoplasm of ovary: Secondary | ICD-10-CM | POA: Insufficient documentation

## 2019-01-28 DIAGNOSIS — Z9189 Other specified personal risk factors, not elsewhere classified: Secondary | ICD-10-CM | POA: Insufficient documentation

## 2019-01-28 DIAGNOSIS — Z1239 Encounter for other screening for malignant neoplasm of breast: Secondary | ICD-10-CM | POA: Insufficient documentation

## 2019-01-28 DIAGNOSIS — Z803 Family history of malignant neoplasm of breast: Secondary | ICD-10-CM | POA: Insufficient documentation

## 2019-01-29 ENCOUNTER — Telehealth: Payer: Self-pay | Admitting: Oncology

## 2019-01-29 NOTE — Telephone Encounter (Signed)
I left a message regarding schedule  

## 2019-02-20 ENCOUNTER — Other Ambulatory Visit: Payer: Self-pay | Admitting: Oncology

## 2019-02-22 ENCOUNTER — Ambulatory Visit
Admission: RE | Admit: 2019-02-22 | Discharge: 2019-02-22 | Disposition: A | Discharge status: Home / Self Care | Payer: Commercial Managed Care - PPO | Source: Ambulatory Visit | Attending: Oncology | Admitting: Oncology

## 2019-02-22 ENCOUNTER — Other Ambulatory Visit: Payer: Self-pay

## 2019-02-22 DIAGNOSIS — Z8041 Family history of malignant neoplasm of ovary: Secondary | ICD-10-CM

## 2019-02-22 DIAGNOSIS — Z803 Family history of malignant neoplasm of breast: Secondary | ICD-10-CM

## 2019-02-22 DIAGNOSIS — Z1239 Encounter for other screening for malignant neoplasm of breast: Secondary | ICD-10-CM

## 2019-02-22 DIAGNOSIS — Z1509 Genetic susceptibility to other malignant neoplasm: Secondary | ICD-10-CM

## 2019-02-22 DIAGNOSIS — Z9189 Other specified personal risk factors, not elsewhere classified: Secondary | ICD-10-CM

## 2019-02-22 DIAGNOSIS — Z1501 Genetic susceptibility to malignant neoplasm of breast: Secondary | ICD-10-CM

## 2019-02-22 IMAGING — MR MR BREAST BILAT WO/W CM
8 of 12 series · 33 of 48 positions shown · IV contrast (6ml gadavist)
Comparison: None

CLINICAL DATA: BRCA 1 positive strong family history of breast
cancer. High risk screening.

LABS:  None
EXAM:
BILATERAL BREAST MRI WITH AND WITHOUT CONTRAST
TECHNIQUE: Multiplanar, multisequence MR images of both breasts were obtained
prior to and following the intravenous administration of 6 ml of
Gadavist

[Series 2: t2_tirm_tra ipat (a-p) · axial · 3.0mm · 0.70mm/px · 1 of 62 slices shown]
[im 1/62]
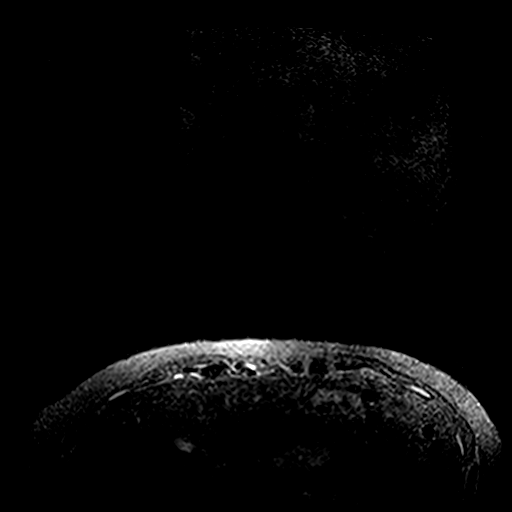

[Series 3: fl3d pre-cm no · axial · non-contrast · 1.2mm · 0.94mm/px · z∈[-101,+109]mm · 5 of 176 slices shown]
[im 1/176]
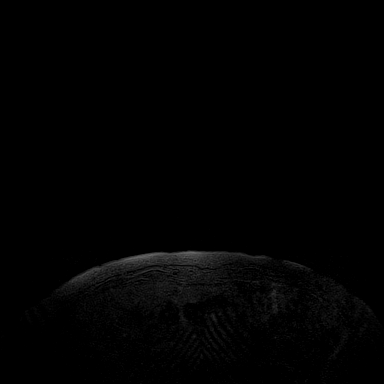
[im 44/176]
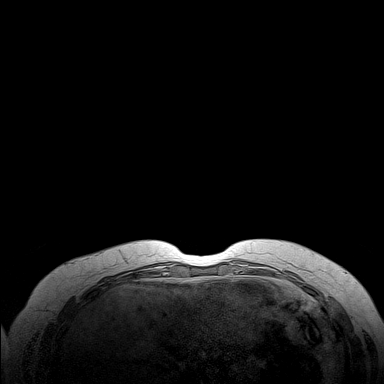
[im 88/176]
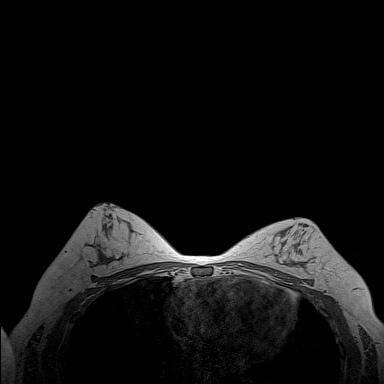
[im 132/176]
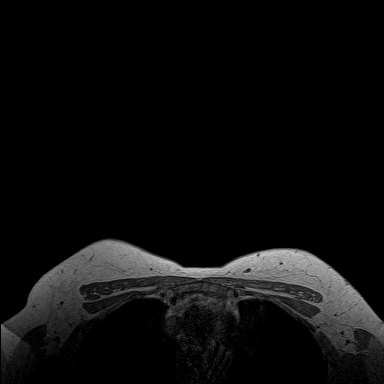
[im 176/176]
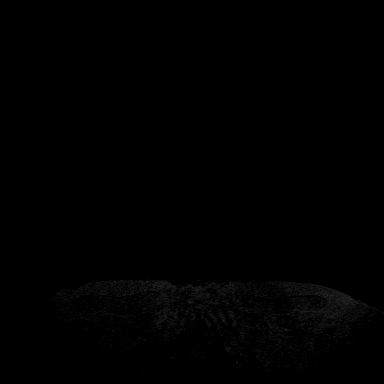

[Series 4: fl3d pre-cm · axial · non-contrast · 1.2mm · 0.94mm/px · z∈[-101,+109]mm · 5 of 176 slices shown]
[im 1/176]
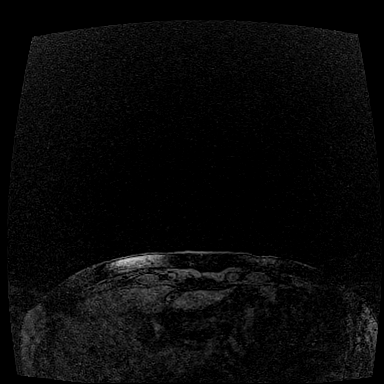
[im 44/176]
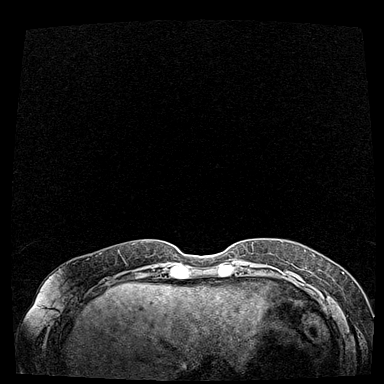
[im 88/176]
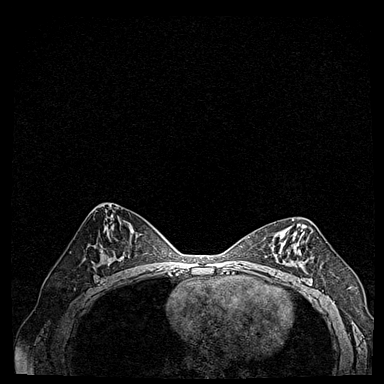
[im 132/176]
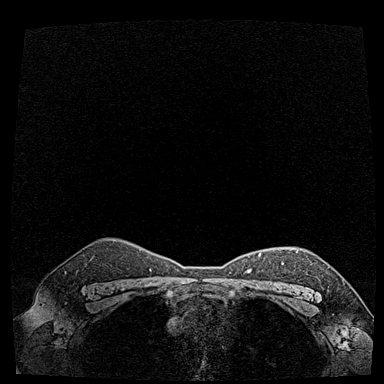
[im 176/176]
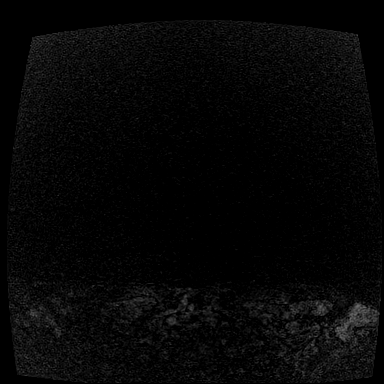

[Series 5: fl3d post-cm 20 · axial · 1.2mm · 0.94mm/px · z∈[-101,+109]mm · 5 of 176 slices shown (1 of 3)]
[im 1/176]
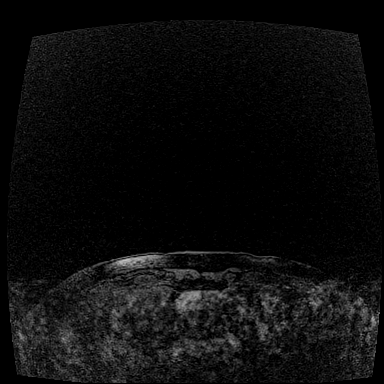
[im 44/176]
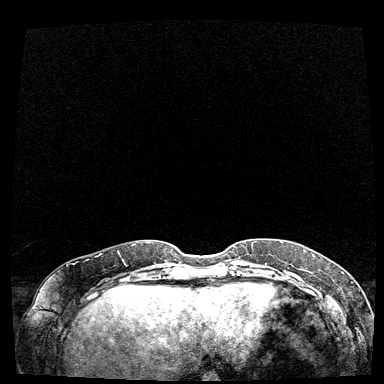
[im 88/176]
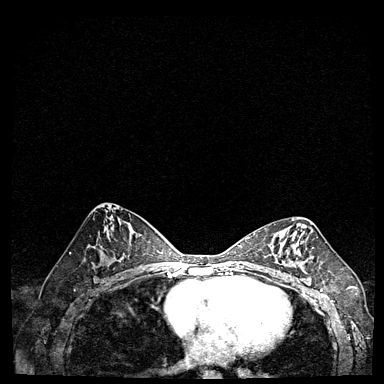
[im 132/176]
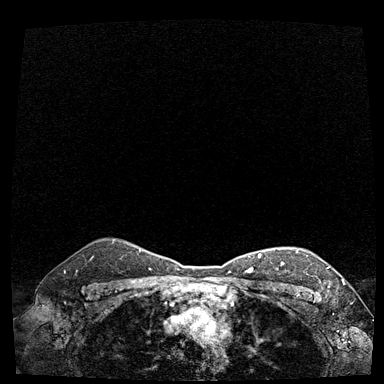
[im 176/176]
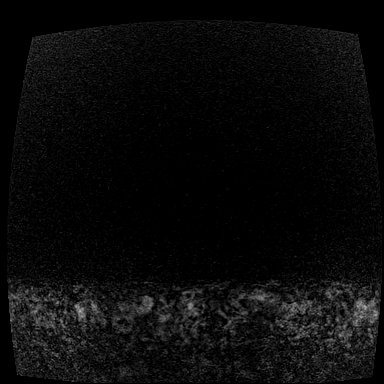

[Series 6: fl3d post-cm 20 · axial · 1.2mm · 0.94mm/px · z∈[-101,+109]mm · 5 of 176 slices shown (2 of 3)]
[im 1/176]
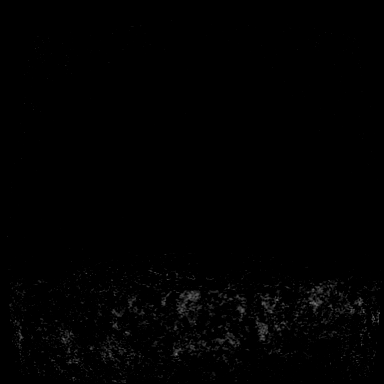
[im 44/176]
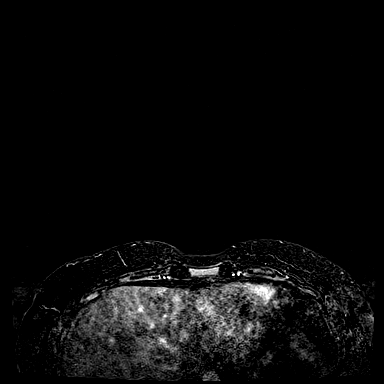
[im 88/176]
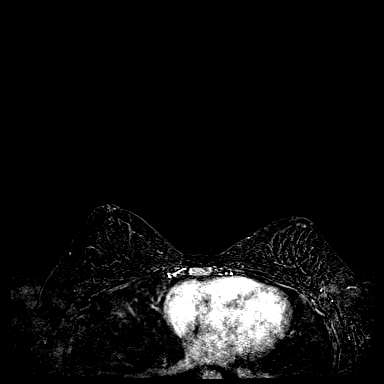
[im 132/176]
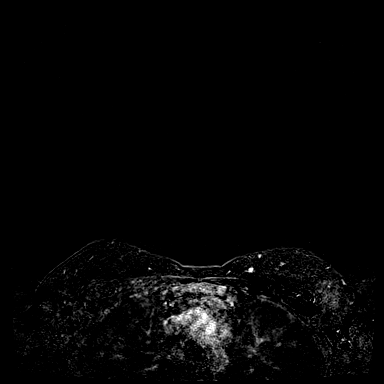
[im 176/176]
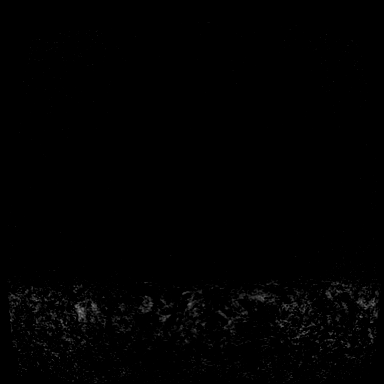

[Series 7: fl3d post-cm 20 · axial · 211.2mm · 0.94mm/px · 1 of 1 slices shown (3 of 3)]
[im 1/1]
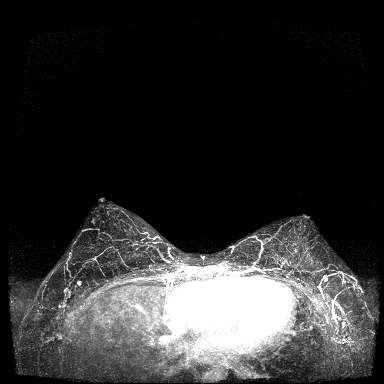

[Series 8: fl3d post-cm 3min · axial · 1.2mm · 0.94mm/px · z∈[-101,+109]mm · 6 of 176 slices shown]
[im 1/176]
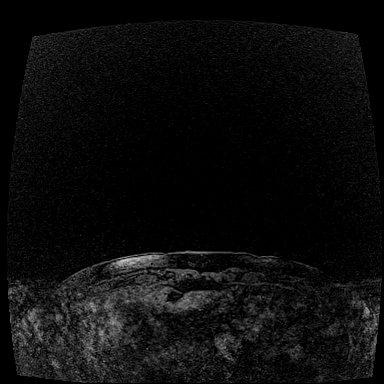
[im 36/176]
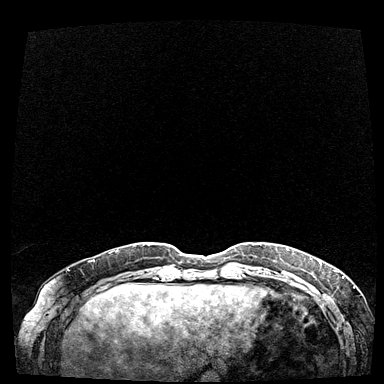
[im 71/176]
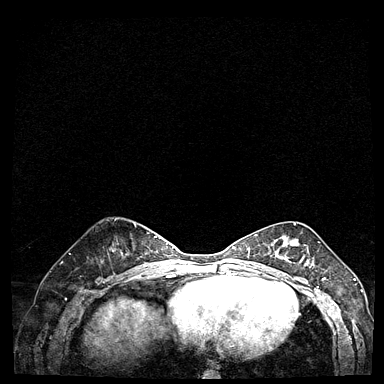
[im 106/176]
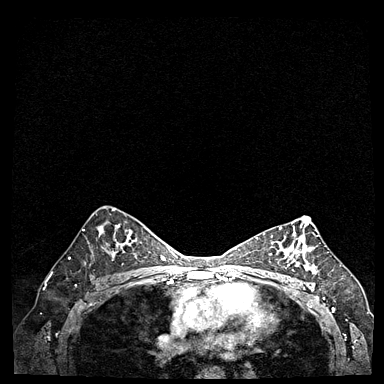
[im 141/176]
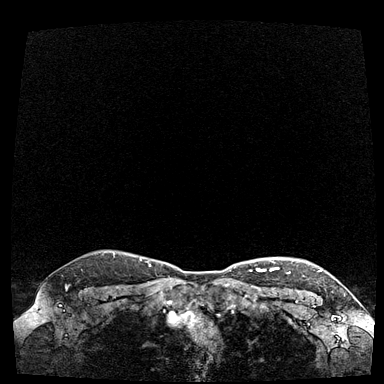
[im 176/176]
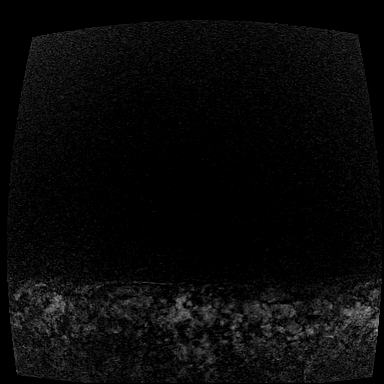

[Series 9: fl3d post-cm 3min_sub · axial · 1.2mm · 0.94mm/px · z∈[-101,+67]mm · 5 of 176 slices shown]
[im 1/176]
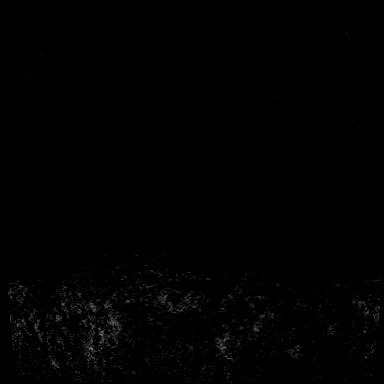
[im 36/176]
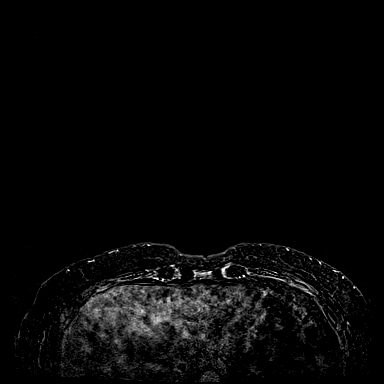
[im 71/176]
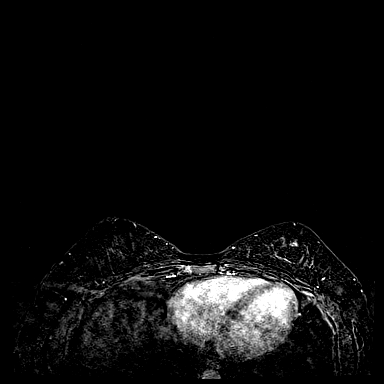
[im 106/176]
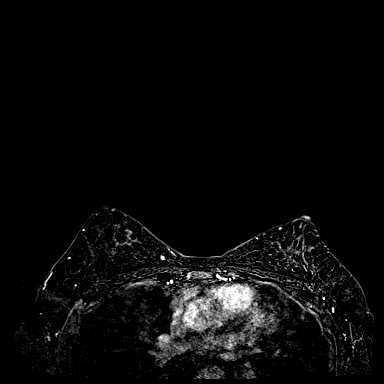
[im 141/176]
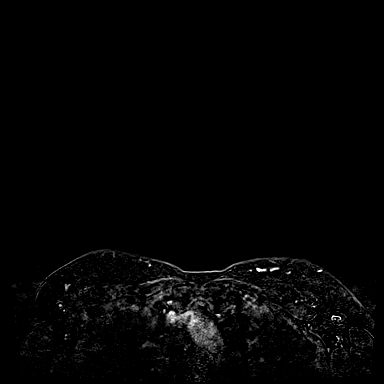

[33 of 48 positions shown; findings below may reference images not displayed]

Three-dimensional MR images were rendered by post-processing of the
original MR data on an independent workstation. The
three-dimensional MR images were interpreted, and findings are
reported in the following complete MRI report for this study. Three
dimensional images were evaluated at the independent [employer]
workstation
FINDINGS: Breast composition: b. Scattered fibroglandular tissue.

Background parenchymal enhancement: Mild.

Right breast: No mass or abnormal enhancement.

Left breast: In the superior central left breast at 12 o'clock there
is a focus of enhancement measuring 0.4 x 0.3 x 0.5 cm (series 9,
image 73) with benign kinetics. No other enhancing mass or abnormal
enhancement identified in the right breast.

Lymph nodes: No abnormal appearing lymph nodes.

Ancillary findings:  None.
IMPRESSION: 1. Enhancing focus measuring 0.5 cm in the 12 o'clock position of
the left breast is suspicious. Tissue sampling is recommended.

2.  No MRI evidence of malignancy in the right breast.

RECOMMENDATION:
MRI guided biopsy of the left breast enhancing focus.

BI-RADS CATEGORY  4: Suspicious.

## 2019-02-22 MED ORDER — GADOBUTROL 1 MMOL/ML IV SOLN
6.0000 mL | Freq: Once | INTRAVENOUS | Status: AC | PRN
  Administered 2019-02-22: 16:00:00 6 mL via INTRAVENOUS

## 2019-02-26 ENCOUNTER — Other Ambulatory Visit: Payer: Self-pay | Admitting: Oncology

## 2019-02-26 DIAGNOSIS — Z1509 Genetic susceptibility to other malignant neoplasm: Secondary | ICD-10-CM

## 2019-02-26 DIAGNOSIS — N6320 Unspecified lump in the left breast, unspecified quadrant: Secondary | ICD-10-CM | POA: Insufficient documentation

## 2019-02-26 DIAGNOSIS — Z1501 Genetic susceptibility to malignant neoplasm of breast: Secondary | ICD-10-CM

## 2019-02-26 NOTE — Progress Notes (Signed)
I called Terri Phillips with the results of her breast MRI, with biopsy to be done.  I entered the order.  If she has again the biopsy results I will call her again.

## 2019-03-05 ENCOUNTER — Ambulatory Visit
Admission: RE | Admit: 2019-03-05 | Discharge: 2019-03-05 | Disposition: A | Discharge status: Home / Self Care | Payer: Commercial Managed Care - PPO | Source: Ambulatory Visit | Attending: Oncology | Admitting: Oncology

## 2019-03-05 ENCOUNTER — Other Ambulatory Visit: Payer: Self-pay | Admitting: Diagnostic Radiology

## 2019-03-05 ENCOUNTER — Other Ambulatory Visit: Payer: Self-pay

## 2019-03-05 DIAGNOSIS — N6320 Unspecified lump in the left breast, unspecified quadrant: Secondary | ICD-10-CM

## 2019-03-05 DIAGNOSIS — Z1501 Genetic susceptibility to malignant neoplasm of breast: Secondary | ICD-10-CM

## 2019-03-05 DIAGNOSIS — Z1509 Genetic susceptibility to other malignant neoplasm: Secondary | ICD-10-CM

## 2019-03-05 IMAGING — MG MM BREAST LOCALIZATION CLIP
4 series · 4 of 12 positions shown · non-contrast
Comparison: Previous exam(s).

CLINICAL DATA: Patient status post MRI guided core needle biopsy
left breast focus of enhancement.

EXAM:
DIAGNOSTIC LEFT MAMMOGRAM POST MRI BIOPSY

[L CC synth-2D]
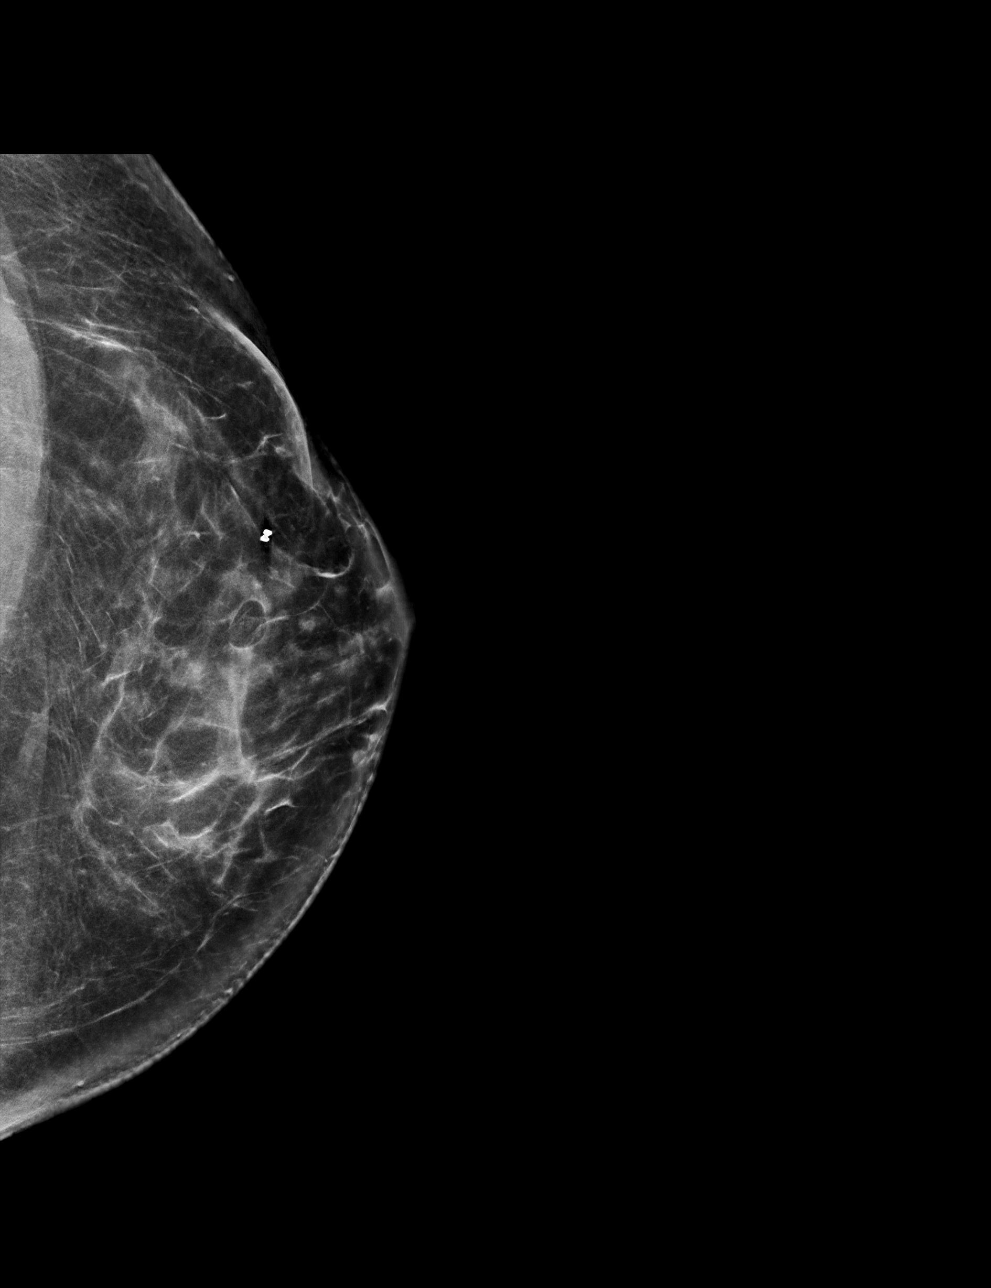

[L ML synth-2D]
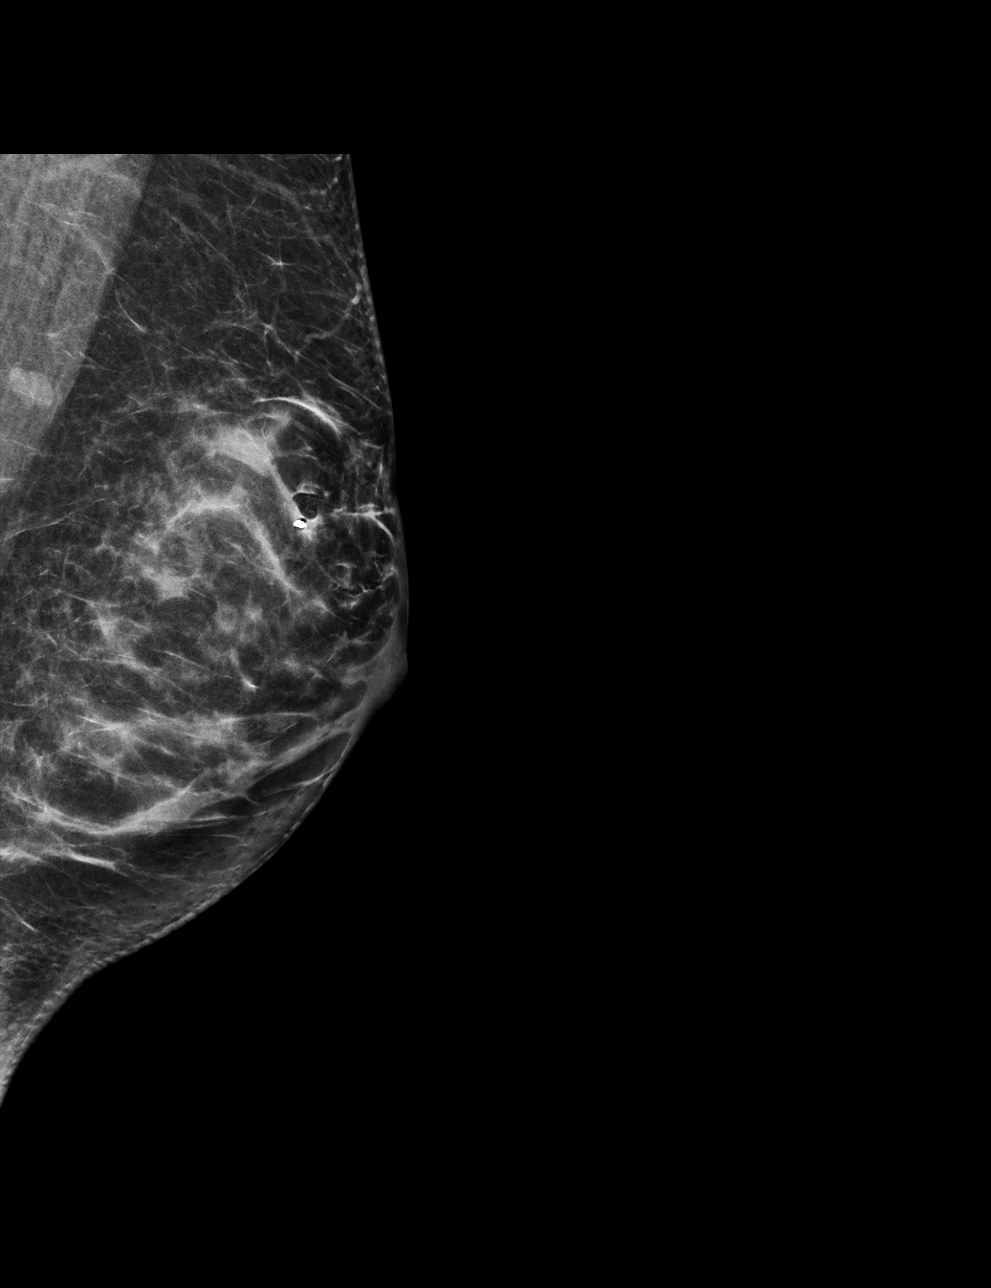

[L CC tomo · tomo slice 36/71.0]
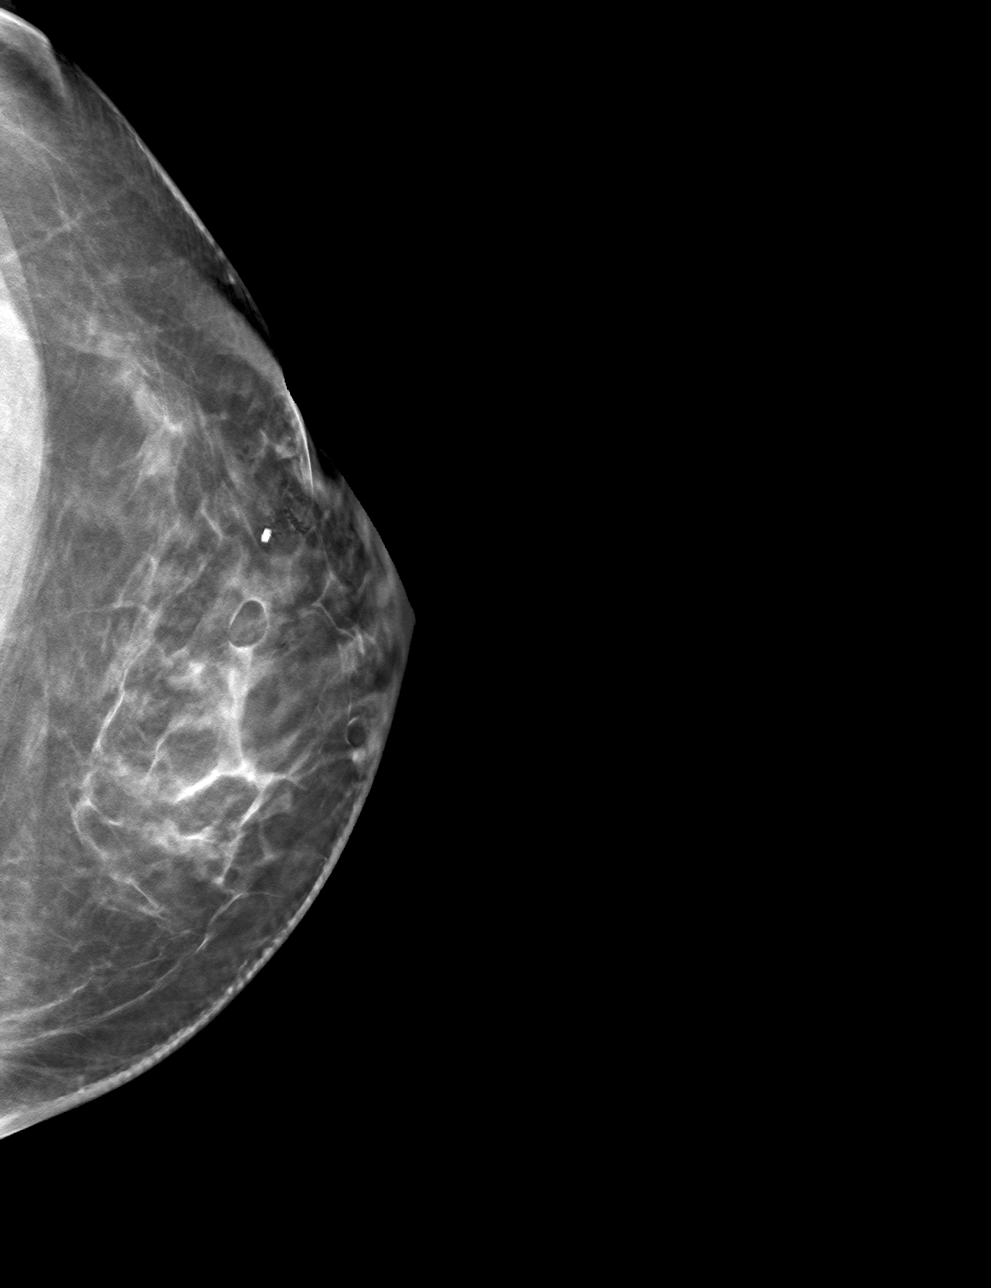

[L ML tomo · tomo slice 31/62.0]
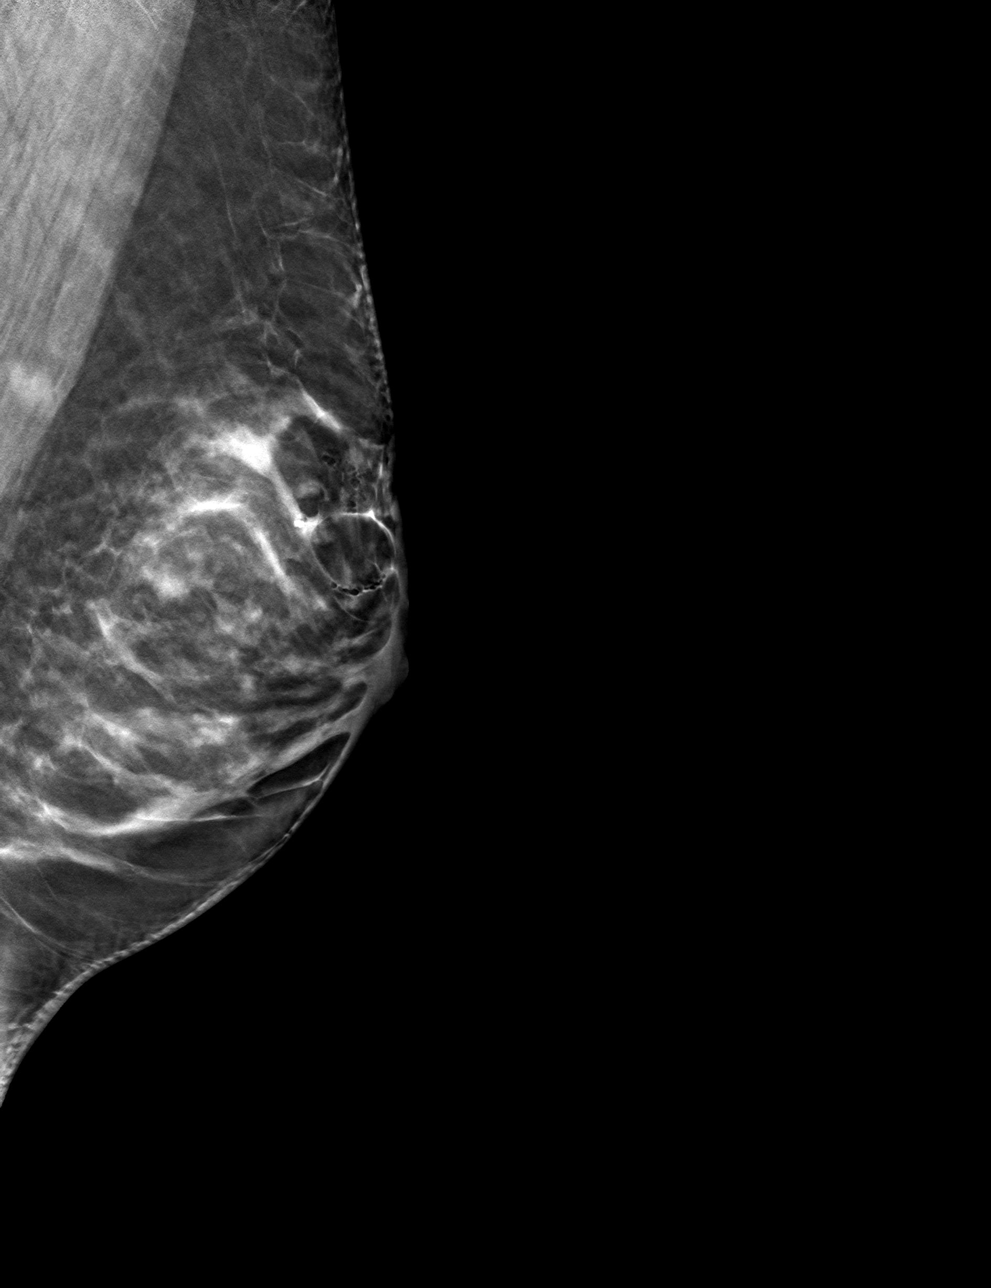

[4 of 12 positions shown; findings below may reference images not displayed]

FINDINGS: Mammographic images were obtained following MRI guided biopsy of
left breast focus of enhancement. The biopsy marking clip is located
approximately 8 mm lateral to the expected site of biopsy.
IMPRESSION: Dumbbell-shaped clip is located approximately 8 mm lateral to the
expected site of biopsy within the left breast.

Final Assessment: Post Procedure Mammograms for Marker Placement

## 2019-03-05 MED ORDER — GADOBUTROL 1 MMOL/ML IV SOLN
5.0000 mL | Freq: Once | INTRAVENOUS | Status: AC | PRN
  Administered 2019-03-05: 5 mL via INTRAVENOUS

## 2019-04-18 LAB — OB RESULTS CONSOLE ANTIBODY SCREEN: Antibody Screen: NEGATIVE

## 2019-04-18 LAB — OB RESULTS CONSOLE RUBELLA ANTIBODY, IGM: Rubella: IMMUNE

## 2019-04-18 LAB — OB RESULTS CONSOLE RPR: RPR: NONREACTIVE

## 2019-04-18 LAB — OB RESULTS CONSOLE HIV ANTIBODY (ROUTINE TESTING): HIV: NONREACTIVE

## 2019-04-18 LAB — OB RESULTS CONSOLE HEPATITIS B SURFACE ANTIGEN: Hepatitis B Surface Ag: NEGATIVE

## 2019-04-18 LAB — OB RESULTS CONSOLE GC/CHLAMYDIA
Chlamydia: NEGATIVE
Gonorrhea: NEGATIVE

## 2019-04-18 LAB — OB RESULTS CONSOLE ABO/RH: RH Type: NEGATIVE

## 2019-04-19 NOTE — L&D Delivery Note (Signed)
Delivery Note Pt reached complete dilation and pushed about 15 minutes.  At 3:51 PM a healthy female was delivered via  (Presentation:  ROA   ).  APGAR: 7, 9; weight pending .   Placenta status:  Was slightly adherent and required some manual manipulation to deliver.  Fundal exploration after delivery with no fragments felt and appeared intact, grade 3.  Cord:   with the following complications: none   Anesthesia:  epidural Episiotomy:  none Lacerations:  none Suture Repair: N/A Est. Blood Loss (mL):   Mom to postpartum.  Baby to Couplet care / Skin to Skin. D/w pt circumcision and they desire to proceed in hospital  Terri Phillips 11/06/2019, 4:18 PM

## 2019-07-30 ENCOUNTER — Inpatient Hospital Stay: Payer: Commercial Managed Care - PPO | Admitting: Oncology

## 2019-09-04 ENCOUNTER — Encounter: Payer: Self-pay | Admitting: Registered"

## 2019-09-04 ENCOUNTER — Encounter: Payer: Commercial Managed Care - PPO | Attending: Obstetrics and Gynecology | Admitting: Registered"

## 2019-09-04 ENCOUNTER — Other Ambulatory Visit: Payer: Self-pay

## 2019-09-04 DIAGNOSIS — O99810 Abnormal glucose complicating pregnancy: Secondary | ICD-10-CM | POA: Diagnosis present

## 2019-09-04 NOTE — Progress Notes (Signed)
Patient was seen on 09/04/19 for Gestational Diabetes self-management class at [facility]. The following learning objectives were met by the patient during this course:   States the definition of Gestational Diabetes  States why dietary management is important in controlling blood glucose  Describes the effects each nutrient has on blood glucose levels  Demonstrates ability to create a balanced meal plan  Demonstrates carbohydrate counting   States when to check blood glucose levels  Demonstrates proper blood glucose monitoring techniques  States the effect of stress and exercise on blood glucose levels  States the importance of limiting caffeine and abstaining from alcohol and smoking  Blood glucose monitor given: none - pt brought meter to class   Patient instructed to monitor glucose levels: FBS: 60 - <95; 1 hour: <140; 2 hour: <120  Patient received handouts:  Nutrition Diabetes and Pregnancy, including carb counting list  Patient will be seen for follow-up as needed.

## 2019-09-11 ENCOUNTER — Ambulatory Visit: Payer: Commercial Managed Care - PPO

## 2019-10-09 LAB — OB RESULTS CONSOLE GBS: GBS: NEGATIVE

## 2019-10-29 ENCOUNTER — Telehealth (HOSPITAL_COMMUNITY): Payer: Self-pay | Admitting: *Deleted

## 2019-10-29 ENCOUNTER — Encounter (HOSPITAL_COMMUNITY): Payer: Self-pay | Admitting: *Deleted

## 2019-10-29 NOTE — Telephone Encounter (Signed)
Preadmission screen  

## 2019-10-31 ENCOUNTER — Telehealth (HOSPITAL_COMMUNITY): Payer: Self-pay | Admitting: *Deleted

## 2019-10-31 NOTE — Telephone Encounter (Signed)
Preadmission screen  

## 2019-11-01 ENCOUNTER — Telehealth (HOSPITAL_COMMUNITY): Payer: Self-pay | Admitting: *Deleted

## 2019-11-01 NOTE — Telephone Encounter (Signed)
Preadmission screen  

## 2019-11-04 ENCOUNTER — Other Ambulatory Visit (HOSPITAL_COMMUNITY)
Admission: RE | Admit: 2019-11-04 | Discharge: 2019-11-04 | Disposition: A | Discharge status: Home / Self Care | Payer: Commercial Managed Care - PPO | Source: Ambulatory Visit | Attending: Obstetrics and Gynecology | Admitting: Obstetrics and Gynecology

## 2019-11-04 LAB — SARS CORONAVIRUS 2 (TAT 6-24 HRS): SARS Coronavirus 2: NEGATIVE

## 2019-11-05 ENCOUNTER — Other Ambulatory Visit: Payer: Self-pay | Admitting: Obstetrics and Gynecology

## 2019-11-05 NOTE — H&P (Deleted)
  The note originally documented on this encounter has been moved the the encounter in which it belongs.  

## 2019-11-05 NOTE — H&P (Signed)
Terri Phillips is a 34 y.o. female G3P1001 at 21 2/7 weeks ( EDD 11/10/19 by 10 week [place])  presenting for induction of labor.   Her prenatal care is significant for diet controlled gestational diabetes.  She has a history of anxiety controlled of zoloft.  She had a low lying placenta which resolved and also a history of preterm delivery at 35 weeks and was on 17-P from 16-36 weeks.  Pt is also a BRCA 1 carrier--PASH on MRI guided biopsy 03/06/19, mammo and MRI yearly  OB History    Gravida  3   Para  1   Term      Preterm  1   AB  1   Living  1     SAB  1   TAB      Ectopic      Multiple  0   Live Births  1         06-18-2017, 35.2 wks. M, 6lbs, NSVD SAB x 1  Past Medical History:  Diagnosis Date  . Anxiety   . BRCA1 gene mutation positive   . Family history of BRCA1 gene positive   . Family history of breast cancer    Past Surgical History:  Procedure Laterality Date  . BREAST BIOPSY    . WISDOM TOOTH EXTRACTION     Family History: family history includes Breast cancer (age of onset: 85) in her cousin; Breast cancer (age of onset: 5) in her mother; Breast cancer (age of onset: 50) in her maternal grandmother; Breast cancer (age of onset: 75) in her maternal aunt; Cancer in her mother. Social History:  reports that she has never smoked. She has never used smokeless tobacco. She reports that she does not drink alcohol and does not use drugs.     Maternal Diabetes: Yes:  Diabetes Type:  Diet controlled Genetic Screening: Normal Maternal Ultrasounds/Referrals: Normal Fetal Ultrasounds or other Referrals:  None Maternal Substance Abuse:  No Significant Maternal Medications:  Meds include: Zoloft Significant Maternal Lab Results:  Group B Strep negative and Rh negative Other Comments:  None  Review of Systems  Constitutional: Negative for fever.  Gastrointestinal: Negative for abdominal pain.  Genitourinary: Negative for pelvic pain.   Maternal Medical  History:  Contractions: Frequency: irregular.   Perceived severity is mild.    Fetal activity: Perceived fetal activity is normal.    Prenatal complications: Diet controlled GDM, anxiety, h/o preterm delivery      unknown if currently breastfeeding. Maternal Exam:  Uterine Assessment: Contraction strength is mild.  Contraction frequency is irregular.   Abdomen: Patient reports no abdominal tenderness. Fetal presentation: vertex  Introitus: Normal vulva. Normal vagina.    Physical Exam Cardiovascular:     Rate and Rhythm: Normal rate and regular rhythm.  Pulmonary:     Effort: Pulmonary effort is normal.     Breath sounds: Normal breath sounds.  Abdominal:     General: Abdomen is flat.  Genitourinary:    General: Normal vulva.  Neurological:     Mental Status: She is alert.  Psychiatric:        Mood and Affect: Mood normal.     Prenatal labs: ABO, Rh: O/Negative/-- (12/31 0000) Antibody: Negative (12/31 0000) Rubella: Immune (12/31 0000) RPR: Nonreactive (12/31 0000)  HBsAg: Negative (12/31 0000)  HIV: Non-reactive (12/31 0000)  GBS: Negative/-- (06/23 0000)  One hour GCT 194 Three hour abnormal Panorama low risk, female  Assessment/Plan Pt for IOL at term.  Plan  AROM and pitocin.  Epidural prn   Logan Bores 11/05/2019, 8:09 PM

## 2019-11-06 ENCOUNTER — Encounter (HOSPITAL_COMMUNITY): Payer: Self-pay | Admitting: Obstetrics and Gynecology

## 2019-11-06 ENCOUNTER — Inpatient Hospital Stay (HOSPITAL_COMMUNITY): Payer: Commercial Managed Care - PPO | Admitting: Anesthesiology

## 2019-11-06 ENCOUNTER — Inpatient Hospital Stay (HOSPITAL_COMMUNITY): Payer: Commercial Managed Care - PPO

## 2019-11-06 ENCOUNTER — Other Ambulatory Visit: Payer: Self-pay

## 2019-11-06 ENCOUNTER — Inpatient Hospital Stay (HOSPITAL_COMMUNITY)
Admission: AD | Admit: 2019-11-06 | Discharge: 2019-11-07 | DRG: 807 | Disposition: A | Discharge status: Home / Self Care | Payer: Commercial Managed Care - PPO | Attending: Obstetrics and Gynecology | Admitting: Obstetrics and Gynecology

## 2019-11-06 DIAGNOSIS — O24420 Gestational diabetes mellitus in childbirth, diet controlled: Secondary | ICD-10-CM | POA: Diagnosis present

## 2019-11-06 DIAGNOSIS — O26893 Other specified pregnancy related conditions, third trimester: Secondary | ICD-10-CM | POA: Diagnosis present

## 2019-11-06 DIAGNOSIS — O99344 Other mental disorders complicating childbirth: Secondary | ICD-10-CM | POA: Diagnosis present

## 2019-11-06 DIAGNOSIS — Z20822 Contact with and (suspected) exposure to covid-19: Secondary | ICD-10-CM | POA: Diagnosis present

## 2019-11-06 DIAGNOSIS — Z6791 Unspecified blood type, Rh negative: Secondary | ICD-10-CM | POA: Diagnosis not present

## 2019-11-06 DIAGNOSIS — F419 Anxiety disorder, unspecified: Secondary | ICD-10-CM | POA: Diagnosis present

## 2019-11-06 DIAGNOSIS — Z3A39 39 weeks gestation of pregnancy: Secondary | ICD-10-CM

## 2019-11-06 LAB — CBC
HCT: 39.8 % (ref 36.0–46.0)
Hemoglobin: 13.2 g/dL (ref 12.0–15.0)
MCH: 30.7 pg (ref 26.0–34.0)
MCHC: 33.2 g/dL (ref 30.0–36.0)
MCV: 92.6 fL (ref 80.0–100.0)
Platelets: 181 10*3/uL (ref 150–400)
RBC: 4.3 MIL/uL (ref 3.87–5.11)
RDW: 13.5 % (ref 11.5–15.5)
WBC: 9.7 10*3/uL (ref 4.0–10.5)
nRBC: 0 % (ref 0.0–0.2)

## 2019-11-06 LAB — BASIC METABOLIC PANEL
Anion gap: 9 (ref 5–15)
BUN: 9 mg/dL (ref 6–20)
CO2: 20 mmol/L — ABNORMAL LOW (ref 22–32)
Calcium: 8.8 mg/dL — ABNORMAL LOW (ref 8.9–10.3)
Chloride: 107 mmol/L (ref 98–111)
Creatinine, Ser: 0.58 mg/dL (ref 0.44–1.00)
GFR calc Af Amer: >60 mL/min (ref >60)
GFR calc non Af Amer: >60 mL/min (ref >60)
Glucose, Bld: 107 mg/dL — ABNORMAL HIGH (ref 70–99)
Potassium: 3.8 mmol/L (ref 3.5–5.1)
Sodium: 136 mmol/L (ref 135–145)

## 2019-11-06 LAB — GLUCOSE, CAPILLARY: Glucose-Capillary: 97 mg/dL (ref 70–99)

## 2019-11-06 LAB — RPR: RPR Ser Ql: NONREACTIVE

## 2019-11-06 LAB — TYPE AND SCREEN
ABO/RH(D): O NEG
Antibody Screen: NEGATIVE

## 2019-11-06 MED ORDER — ONDANSETRON HCL 4 MG PO TABS: 4.0000 mg | ORAL_TABLET | ORAL | Status: DC | PRN

## 2019-11-06 MED ORDER — LACTATED RINGERS IV SOLN
500.0000 mL | Freq: Once | INTRAVENOUS | Status: AC
  Administered 2019-11-06: 500 mL via INTRAVENOUS

## 2019-11-06 MED ORDER — ACETAMINOPHEN 325 MG PO TABS: 650.0000 mg | ORAL_TABLET | ORAL | Status: DC | PRN

## 2019-11-06 MED ORDER — SERTRALINE HCL 100 MG PO TABS
100.0000 mg | ORAL_TABLET | Freq: Every day | ORAL | Status: DC
  Administered 2019-11-06: 100 mg via ORAL
  Filled 2019-11-06: qty 1

## 2019-11-06 MED ORDER — DIBUCAINE (PERIANAL) 1 % EX OINT: 1.0000 "application " | TOPICAL_OINTMENT | CUTANEOUS | Status: DC | PRN

## 2019-11-06 MED ORDER — LACTATED RINGERS IV SOLN: 500.0000 mL | INTRAVENOUS | Status: DC | PRN

## 2019-11-06 MED ORDER — LIDOCAINE HCL (PF) 1 % IJ SOLN: 30.0000 mL | INTRAMUSCULAR | Status: DC | PRN

## 2019-11-06 MED ORDER — IBUPROFEN 600 MG PO TABS
600.0000 mg | ORAL_TABLET | Freq: Four times a day (QID) | ORAL | Status: DC
  Administered 2019-11-06 – 2019-11-07 (×4): 600 mg via ORAL
  Filled 2019-11-06 (×4): qty 1

## 2019-11-06 MED ORDER — COCONUT OIL OIL: 1.0000 "application " | TOPICAL_OIL | Status: DC | PRN

## 2019-11-06 MED ORDER — SENNOSIDES-DOCUSATE SODIUM 8.6-50 MG PO TABS
2.0000 | ORAL_TABLET | ORAL | Status: DC
  Administered 2019-11-06: 2 via ORAL
  Filled 2019-11-06: qty 2

## 2019-11-06 MED ORDER — ONDANSETRON HCL 4 MG/2ML IJ SOLN: 4.0000 mg | Freq: Four times a day (QID) | INTRAMUSCULAR | Status: DC | PRN

## 2019-11-06 MED ORDER — OXYTOCIN-SODIUM CHLORIDE 30-0.9 UT/500ML-% IV SOLN
2.5000 [IU]/h | INTRAVENOUS | Status: DC
  Administered 2019-11-06: 2.5 [IU]/h via INTRAVENOUS

## 2019-11-06 MED ORDER — DIPHENHYDRAMINE HCL 25 MG PO CAPS: 25.0000 mg | ORAL_CAPSULE | Freq: Four times a day (QID) | ORAL | Status: DC | PRN

## 2019-11-06 MED ORDER — LACTATED RINGERS IV SOLN: INTRAVENOUS | Status: DC

## 2019-11-06 MED ORDER — OXYTOCIN BOLUS FROM INFUSION
333.0000 mL | Freq: Once | INTRAVENOUS | Status: AC
  Administered 2019-11-06: 333 mL via INTRAVENOUS

## 2019-11-06 MED ORDER — LACTATED RINGERS IV SOLN: 500.0000 mL | Freq: Once | INTRAVENOUS | Status: DC

## 2019-11-06 MED ORDER — BUTORPHANOL TARTRATE 1 MG/ML IJ SOLN: 1.0000 mg | INTRAMUSCULAR | Status: DC | PRN

## 2019-11-06 MED ORDER — PHENYLEPHRINE 40 MCG/ML (10ML) SYRINGE FOR IV PUSH (FOR BLOOD PRESSURE SUPPORT): 80.0000 ug | PREFILLED_SYRINGE | INTRAVENOUS | Status: DC | PRN

## 2019-11-06 MED ORDER — OXYCODONE-ACETAMINOPHEN 5-325 MG PO TABS: 1.0000 | ORAL_TABLET | ORAL | Status: DC | PRN

## 2019-11-06 MED ORDER — SIMETHICONE 80 MG PO CHEW: 80.0000 mg | CHEWABLE_TABLET | ORAL | Status: DC | PRN

## 2019-11-06 MED ORDER — WITCH HAZEL-GLYCERIN EX PADS: 1.0000 "application " | MEDICATED_PAD | CUTANEOUS | Status: DC | PRN

## 2019-11-06 MED ORDER — DIPHENHYDRAMINE HCL 50 MG/ML IJ SOLN: 12.5000 mg | INTRAMUSCULAR | Status: DC | PRN

## 2019-11-06 MED ORDER — TETANUS-DIPHTH-ACELL PERTUSSIS 5-2.5-18.5 LF-MCG/0.5 IM SUSP: 0.5000 mL | Freq: Once | INTRAMUSCULAR | Status: DC

## 2019-11-06 MED ORDER — SODIUM CHLORIDE (PF) 0.9 % IJ SOLN
INTRAMUSCULAR | Status: DC | PRN
  Administered 2019-11-06: 12 mL/h via EPIDURAL

## 2019-11-06 MED ORDER — OXYCODONE-ACETAMINOPHEN 5-325 MG PO TABS: 2.0000 | ORAL_TABLET | ORAL | Status: DC | PRN

## 2019-11-06 MED ORDER — PHENYLEPHRINE 40 MCG/ML (10ML) SYRINGE FOR IV PUSH (FOR BLOOD PRESSURE SUPPORT)
80.0000 ug | PREFILLED_SYRINGE | INTRAVENOUS | Status: DC | PRN
  Filled 2019-11-06: qty 10

## 2019-11-06 MED ORDER — EPHEDRINE 5 MG/ML INJ: 10.0000 mg | INTRAVENOUS | Status: DC | PRN

## 2019-11-06 MED ORDER — OXYTOCIN-SODIUM CHLORIDE 30-0.9 UT/500ML-% IV SOLN
1.0000 m[IU]/min | INTRAVENOUS | Status: DC
  Administered 2019-11-06: 2 m[IU]/min via INTRAVENOUS
  Filled 2019-11-06 (×2): qty 500

## 2019-11-06 MED ORDER — ONDANSETRON HCL 4 MG/2ML IJ SOLN: 4.0000 mg | INTRAMUSCULAR | Status: DC | PRN

## 2019-11-06 MED ORDER — SOD CITRATE-CITRIC ACID 500-334 MG/5ML PO SOLN: 30.0000 mL | ORAL | Status: DC | PRN

## 2019-11-06 MED ORDER — TERBUTALINE SULFATE 1 MG/ML IJ SOLN: 0.2500 mg | Freq: Once | INTRAMUSCULAR | Status: DC | PRN

## 2019-11-06 MED ORDER — BENZOCAINE-MENTHOL 20-0.5 % EX AERO: 1.0000 "application " | INHALATION_SPRAY | CUTANEOUS | Status: DC | PRN

## 2019-11-06 MED ORDER — FENTANYL-BUPIVACAINE-NACL 0.5-0.125-0.9 MG/250ML-% EP SOLN
12.0000 mL/h | EPIDURAL | Status: DC | PRN
  Filled 2019-11-06: qty 250

## 2019-11-06 MED ORDER — ZOLPIDEM TARTRATE 5 MG PO TABS: 5.0000 mg | ORAL_TABLET | Freq: Every evening | ORAL | Status: DC | PRN

## 2019-11-06 MED ORDER — LIDOCAINE HCL (PF) 1 % IJ SOLN
INTRAMUSCULAR | Status: DC | PRN
  Administered 2019-11-06: 5 mL via EPIDURAL
  Administered 2019-11-06: 3 mL via EPIDURAL

## 2019-11-06 MED ORDER — PRENATAL MULTIVITAMIN CH
1.0000 | ORAL_TABLET | Freq: Every day | ORAL | Status: DC
  Administered 2019-11-07: 1 via ORAL
  Filled 2019-11-06: qty 1

## 2019-11-06 NOTE — Anesthesia Preprocedure Evaluation (Signed)
Anesthesia Evaluation  Patient identified by MRN, date of birth, ID band Patient awake    Reviewed: Allergy & Precautions, H&P , NPO status , Patient's Chart, lab work & pertinent test results  Airway Mallampati: II   Neck ROM: full    Dental   Pulmonary neg pulmonary ROS,    breath sounds clear to auscultation       Cardiovascular negative cardio ROS   Rhythm:regular Rate:Normal     Neuro/Psych PSYCHIATRIC DISORDERS Anxiety    GI/Hepatic   Endo/Other    Renal/GU      Musculoskeletal   Abdominal   Peds  Hematology   Anesthesia Other Findings   Reproductive/Obstetrics (+) Pregnancy                             Anesthesia Physical Anesthesia Plan  ASA: I  Anesthesia Plan: Epidural   Post-op Pain Management:    Induction: Intravenous  PONV Risk Score and Plan: 2 and Treatment may vary due to age or medical condition  Airway Management Planned: Natural Airway  Additional Equipment:   Intra-op Plan:   Post-operative Plan:   Informed Consent: I have reviewed the patients History and Physical, chart, labs and discussed the procedure including the risks, benefits and alternatives for the proposed anesthesia with the patient or authorized representative who has indicated his/her understanding and acceptance.       Plan Discussed with: Anesthesiologist  Anesthesia Plan Comments:         Anesthesia Quick Evaluation

## 2019-11-06 NOTE — Lactation Note (Signed)
This note was copied from a baby's chart. Lactation Consultation Note  Patient Name: Boy Virgia Kelner [id number] Date: 11/06/2019  Mom is a G2P2.  Baby boy Sharlet Salina now 5 hours old.  Mom reports her first baby was born premature and he never latched well and he lost weight so she ended up completely formula feeding with him.   Mom reports baby Sharlet Salina is wanting to eat often and that her nipples are already sore.  Sharlet Salina in crib content at this time.  Discussed positioning and latch.  Mom requested some coconut oil.  Urged mom to always apply her milk first and pat it on there and let air dry and then apply the coconut oil.  Went to get mom coconut oil and came back and Benjamim crying and mom trying to latch him.  Assist briefly with trying to latch him.  He was cuing and crying but will not latch.  Put STS.  Sharlet Salina immediately calmed and fell asleep.     Maternal Data    Feeding    LATCH Score                   Interventions    Lactation Tools Discussed/Used     Consult Status      Kaytee Taliercio Michaelle Copas 11/06/2019, 10:50 PM

## 2019-11-06 NOTE — Progress Notes (Signed)
Patient ID: Terri Phillips, female   DOB: Aug 24, 1985, 34 y.o.   MRN: [id number] Pt admitted and started on pitocin  FHR category 1  AROM clear fluid  Cervix 2/70/-2   Continue pitocin CBG 94 Epidural prn

## 2019-11-06 NOTE — Anesthesia Procedure Notes (Signed)
Epidural Patient location during procedure: OB Start time: 11/06/2019 1:42 PM End time: 11/06/2019 1:52 PM  Staffing Anesthesiologist: Achille Rich, MD Performed: anesthesiologist   Preanesthetic Checklist Completed: patient identified, IV checked, site marked, risks and benefits discussed, monitors and equipment checked, pre-op evaluation and timeout performed  Epidural Patient position: sitting Prep: DuraPrep Patient monitoring: heart rate, cardiac monitor, continuous pulse ox and blood pressure Approach: midline Location: L2-L3 Injection technique: LOR saline  Needle:  Needle type: Tuohy  Needle gauge: 17 G Needle length: 9 cm Needle insertion depth: 5 cm Catheter type: closed end flexible Catheter size: 19 Gauge Catheter at skin depth: 11 cm Test dose: negative and Other  Assessment Events: blood not aspirated, injection not painful, no injection resistance and negative IV test  Additional Notes Informed consent obtained prior to proceeding including risk of failure, 1% risk of PDPH, risk of minor discomfort and bruising.  Discussed rare but serious complications including epidural abscess, permanent nerve injury, epidural hematoma.  Discussed alternatives to epidural analgesia and patient desires to proceed.  Timeout performed pre-procedure verifying patient name, procedure, and platelet count.  Patient tolerated procedure well. Reason for block:procedure for pain

## 2019-11-07 LAB — CBC
HCT: 36.2 % (ref 36.0–46.0)
Hemoglobin: 11.6 g/dL — ABNORMAL LOW (ref 12.0–15.0)
MCH: 29.7 pg (ref 26.0–34.0)
MCHC: 32 g/dL (ref 30.0–36.0)
MCV: 92.8 fL (ref 80.0–100.0)
Platelets: 149 10*3/uL — ABNORMAL LOW (ref 150–400)
RBC: 3.9 MIL/uL (ref 3.87–5.11)
RDW: 13.6 % (ref 11.5–15.5)
WBC: 12.1 10*3/uL — ABNORMAL HIGH (ref 4.0–10.5)
nRBC: 0 % (ref 0.0–0.2)

## 2019-11-07 MED ORDER — IBUPROFEN 800 MG PO TABS
800.0000 mg | ORAL_TABLET | Freq: Three times a day (TID) | ORAL | 1 refills | Status: AC
Start: 2019-11-07 — End: ?

## 2019-11-07 NOTE — Discharge Summary (Signed)
Postpartum Discharge Summary  Date of Service updated     Patient Name: Terri Phillips DOB: 1985-10-17 MRN: [id number]  Date of admission: 11/06/2019 Delivery date:11/06/2019  Delivering provider: Paula Compton  Date of discharge: 11/07/2019  Admitting diagnosis: Indication for care in labor or delivery [O75.9] NSVD (normal spontaneous vaginal delivery) [O80] Intrauterine pregnancy: [redacted]w[redacted]d    Secondary diagnosis:  Active Problems:   Indication for care in labor or delivery   NSVD (normal spontaneous vaginal delivery)  Additional problems: None    Discharge diagnosis: Term Pregnancy Delivered                                              Post partum procedures:none Augmentation: AROM and Pitocin Complications: None  Hospital course: Induction of Labor With Vaginal Delivery   34y.o. yo [id number] admitted to the hospital 11/06/2019 for induction of labor.  Indication for induction: Favorable cervix at term.  Patient had an uncomplicated labor course as follows: Membrane Rupture Time/Date: 9:43 AM ,11/06/2019   Delivery Method:Vaginal, Spontaneous  Episiotomy: None  Lacerations:  None  Details of delivery can be found in separate delivery note.  Patient had a routine postpartum course. Patient is discharged home 11/07/19.  Newborn Data: Birth date:11/06/2019  Birth time:3:51 PM  Gender:Female  Living status:Living  Apgars:8 ,9  Weight:3549 g   Magnesium Sulfate received: No BMZ received: No Rhophylac:No MMR:Yes T-DaP:Given prenatally Flu: Yes Transfusion:No  Physical exam  Vitals:   11/06/19 1915 11/06/19 2315 11/07/19 0315 11/07/19 0715  BP: (!) 114/59 (!) 109/59 101/63 (!) 107/48  Pulse: 68 71 68 73  Resp: '18 18 18 18  ' Temp: 98.2 F (36.8 C) 98.7 F (37.1 C) 98.2 F (36.8 C) 98.6 F (37 C)  TempSrc: Oral Oral Oral   SpO2: 99% 99% 99%   Weight:      Height:       General: alert, cooperative and no distress Lochia: appropriate Uterine  Fundus: firm Incision: N/A DVT Evaluation: No evidence of DVT seen on physical exam. Negative Homan's sign. No cords or calf tenderness. Labs: Lab Results  Component Value Date   WBC 12.1 (H) 11/07/2019   HGB 11.6 (L) 11/07/2019   HCT 36.2 11/07/2019   MCV 92.8 11/07/2019   PLT 149 (L) 11/07/2019   CMP Latest Ref Rng & Units 11/06/2019  Glucose 70 - 99 mg/dL 107(H)  BUN 6 - 20 mg/dL 9  Creatinine 0.44 - 1.00 mg/dL 0.58  Sodium 135 - 145 mmol/L 136  Potassium 3.5 - 5.1 mmol/L 3.8  Chloride 98 - 111 mmol/L 107  CO2 22 - 32 mmol/L 20(L)  Calcium 8.9 - 10.3 mg/dL 8.8(L)   EdFlavia Shippercore: Edinburgh Postnatal Depression Scale Screening Tool 06/20/2017  I have been able to laugh and see the funny side of things. 0  I have looked forward with enjoyment to things. 0  I have blamed myself unnecessarily when things went wrong. 0  I have been anxious or worried for no good reason. 2  I have felt scared or panicky for no good reason. 0  Things have been getting on top of me. 0  I have been so unhappy that I have had difficulty sleeping. 0  I have felt sad or miserable. 1  I have been so unhappy that I have been crying. 0  The thought of harming myself has occurred to me. 0  Edinburgh Postnatal Depression Scale Total 3      After visit meds:  Allergies as of 11/07/2019   No Known Allergies     Medication List    TAKE these medications   ibuprofen 800 MG tablet Commonly known as: ADVIL Take 1 tablet (800 mg total) by mouth 3 (three) times daily.   Prenatal Vitamin 27-0.8 MG Tabs Take by mouth.   sertraline 100 MG tablet Commonly known as: ZOLOFT Take 100 mg by mouth at bedtime.        Discharge home in stable condition Infant Feeding: Breast Infant Disposition:home with mother Discharge instruction: per After Visit Summary and Postpartum booklet. Activity: Advance as tolerated. Pelvic rest for 6 weeks.  Diet: routine diet Anticipated Birth Control:  Unsure Postpartum Appointment:6 weeks Additional Postpartum F/U: 2 hour GTT Future Appointments:No future appointments. Follow up Visit:      11/07/2019 Deliah Boston, MD

## 2019-11-07 NOTE — Progress Notes (Signed)
MOB was referred for history of anxiety. * Referral screened out by Clinical Social Worker because none of the following criteria appear to apply: ~ History of anxiety/depression during this pregnancy, or of post-partum depression following prior delivery. ~ Diagnosis of anxiety and/or depression within last 3 years OR * MOB's symptoms currently being treated with medication and/or therapy. Per chart review, MOB is currently prescribed/taking Zoloft.  Please contact the Clinical Social Worker if needs arise, by MOB request, or if MOB scores greater than 9/yes to question 10 on Edinburgh Postpartum Depression Screen.  Piper Hassebrock, LCSW Clinical Social Worker [facility] Cell#: [phone] 

## 2019-11-07 NOTE — Progress Notes (Signed)
POSTPARTUM PROGRESS NOTE  Post Partum Day #1  Subjective:  No acute events overnight.  Pt denies problems with ambulating, voiding or po intake.  She denies nausea or vomiting.  Pain is well controlled.  Lochia Minimal. Desires circ today and possible discharge this afternoon pending peds clearance  Objective: Blood pressure (!) 107/48, pulse 73, temperature 98.6 F (37 C), resp. rate 18, height 5\' 4"  (1.626 m), weight 77.1 kg, SpO2 99 %, unknown if currently breastfeeding.  Physical Exam:  General: alert, cooperative and no distress Lochia:normal flow Chest: CTAB Heart: RRR no m/r/g Abdomen: +BS, soft, nontender Uterine Fundus: firm, 2cm below umbilicus Extremities: neg edema, neg calf TTP BL, neg Homans BL  Recent Labs    11/06/19 0751 11/07/19 0608  HGB 13.2 11.6*  HCT 39.8 36.2    Assessment/Plan:  ASSESSMENT: Terri Phillips is a 34 y.o. 32 s/p SVD @ [redacted]w[redacted]d. PNC c/b GDMA1, resolved low lying placenta, anxiety on Zoloft, h/o PTD on Makena this pregnancy.   Discharge home, Breastfeeding and Circumcision prior to discharge  GDMA1 - elects for 2hr GTT at postpartum visit Anxiety - continue PO Zoloft Circ scheduled for 1PM today Will place discharge orders, please notify if baby not cleared   LOS: 1 day

## 2019-11-07 NOTE — Anesthesia Postprocedure Evaluation (Signed)
Anesthesia Post Note  Patient: Terri Phillips  Procedure(s) Performed: AN AD HOC LABOR EPIDURAL     Patient location during evaluation: Mother Baby Anesthesia Type: Epidural Level of consciousness: awake and alert Pain management: pain level controlled Vital Signs Assessment: post-procedure vital signs reviewed and stable Respiratory status: spontaneous breathing, nonlabored ventilation and respiratory function stable Cardiovascular status: stable Postop Assessment: no headache, no backache, epidural receding, no apparent nausea or vomiting, patient able to bend at knees, adequate PO intake and able to ambulate Anesthetic complications: no   No complications documented.  Last Vitals:  Vitals:   11/07/19 0315 11/07/19 0715  BP: 101/63 (!) 107/48  Pulse: 68 73  Resp: 18 18  Temp: 36.8 C 37 C  SpO2: 99%     Last Pain:  Vitals:   11/07/19 0715  TempSrc:   PainSc: 0-No pain   Pain Goal:                   [employer]

## 2019-11-07 NOTE — Lactation Note (Addendum)
This note was copied from a baby's chart. Lactation Consultation Note  Patient Name: Boy Nandika Stetzer [id number] Date: 11/07/2019   P2, Baby 77 hours old.  Parents state they had breastfeeding difficulty and weight loss with first child and switched to formula.  Stated infant had frenotomy at 3 months. Noted this baby has short anterior lingual frenulum limiting tongue protrusion. Mother complaining of soreness  Parents plan to meet with [facility] to discuss. Mother states infant has shallow latch. Explained it could be due to limited tongue movement. Suggest post pumping with DEBP at least 4 times per day and give volume back to baby. Mother has been hand expressing and spoon feeding in addition to breastfeeding.  Provided mother with comfort gels due to tender nipples. Observed latch.  Placed pillows under baby to bring to nipple height and had mother sit back and relax. Feed on demand with cues.  Goal 8-12+ times per day after first 24 hrs.  Place baby STS if not cueing.  Reviewed engorgement care and monitoring voids/stools. Discussed fenugreek which was discouraged due to BRCA gene.        Maternal Data    Feeding Feeding Type: Breast Fed  LATCH Score                   Interventions    Lactation Tools Discussed/Used     Consult Status      Carlye Grippe 11/07/2019, 12:24 PM

## 2020-03-05 ENCOUNTER — Other Ambulatory Visit: Payer: Self-pay | Admitting: Oncology

## 2020-03-05 DIAGNOSIS — Z803 Family history of malignant neoplasm of breast: Secondary | ICD-10-CM

## 2020-03-05 DIAGNOSIS — Z8041 Family history of malignant neoplasm of ovary: Secondary | ICD-10-CM

## 2020-03-05 DIAGNOSIS — Z1231 Encounter for screening mammogram for malignant neoplasm of breast: Secondary | ICD-10-CM

## 2020-04-21 ENCOUNTER — Ambulatory Visit
Admission: RE | Admit: 2020-04-21 | Discharge: 2020-04-21 | Disposition: A | Discharge status: Home / Self Care | Payer: Commercial Managed Care - PPO | Source: Ambulatory Visit | Attending: Oncology | Admitting: Oncology

## 2020-04-21 ENCOUNTER — Other Ambulatory Visit: Payer: Self-pay | Admitting: Oncology

## 2020-04-21 ENCOUNTER — Other Ambulatory Visit: Payer: Self-pay

## 2020-04-21 DIAGNOSIS — Z8041 Family history of malignant neoplasm of ovary: Secondary | ICD-10-CM

## 2020-04-21 DIAGNOSIS — Z803 Family history of malignant neoplasm of breast: Secondary | ICD-10-CM

## 2020-04-21 IMAGING — MG DIGITAL SCREENING BILAT W/ TOMO W/ CAD
8 series · 9 of 24 positions shown · non-contrast
Comparison: Previous exam(s).

CLINICAL DATA: Screening.

EXAM:
DIGITAL SCREENING BILATERAL MAMMOGRAM WITH TOMO AND CAD

[R MLO synth-2D]
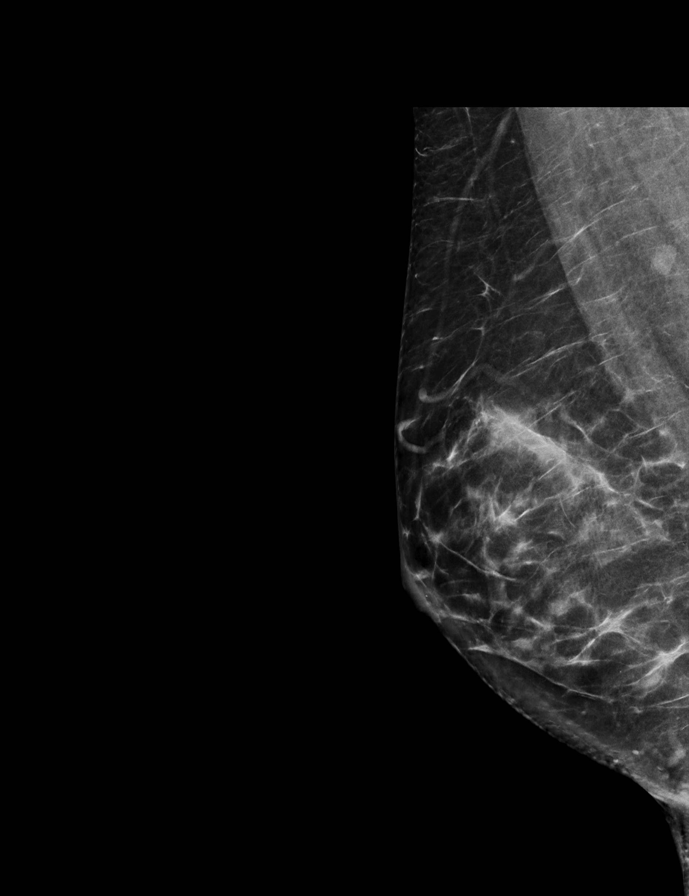

[L CC synth-2D]
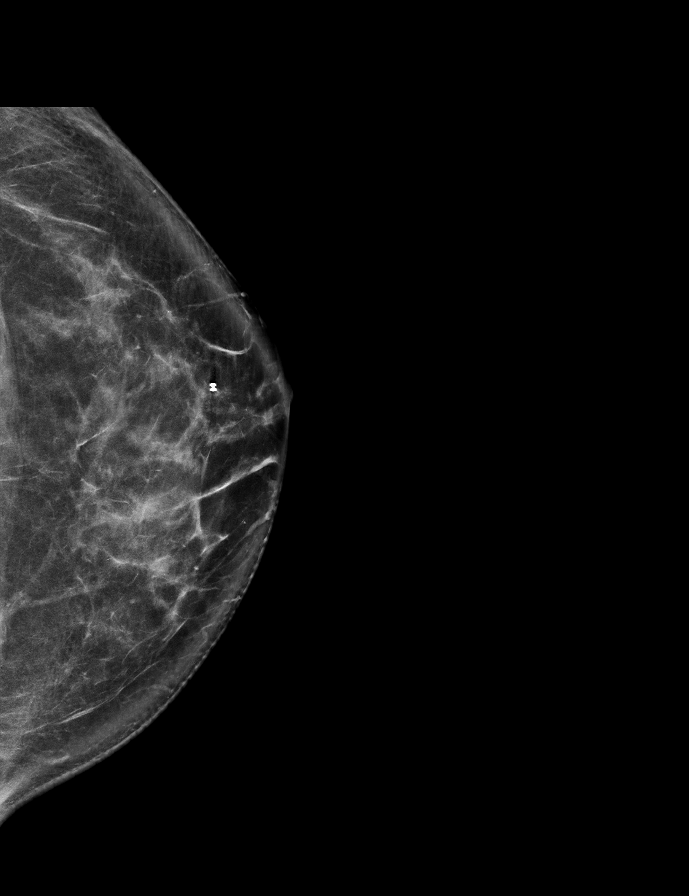

[R CC synth-2D]
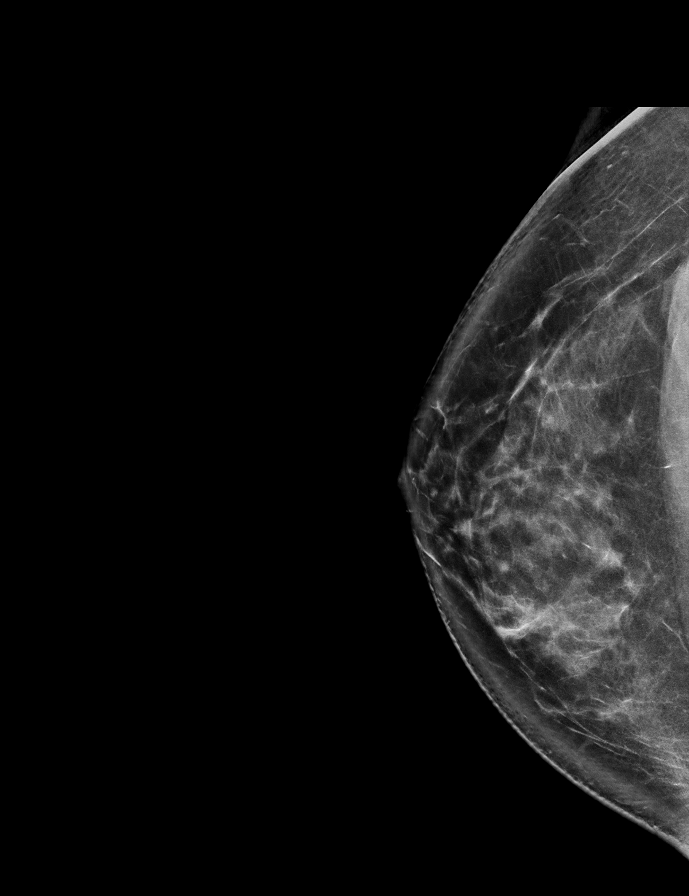

[L MLO synth-2D]
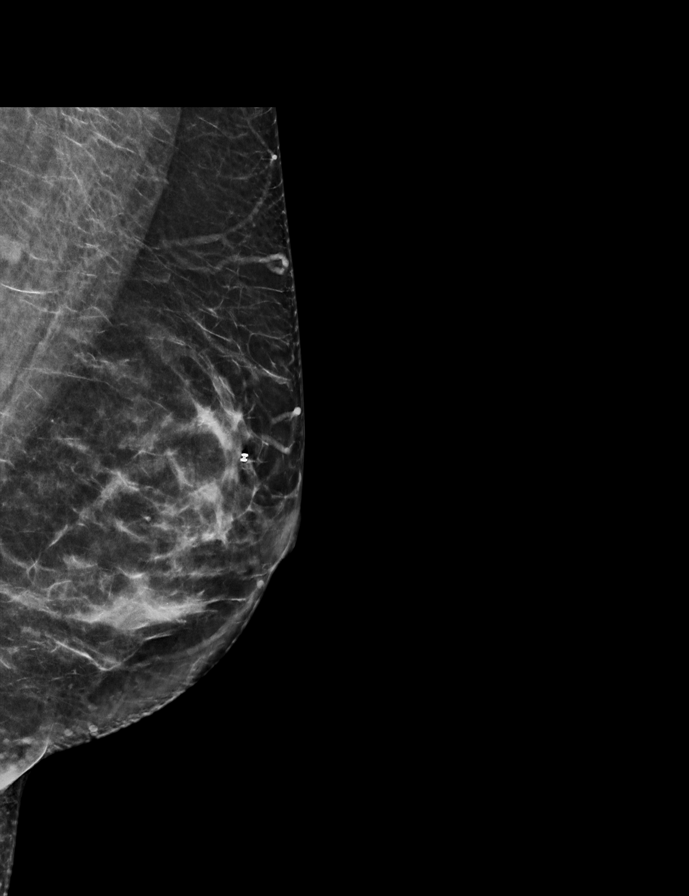

[R MLO tomo · 2 of 73 frames shown]
[frame 24/73]
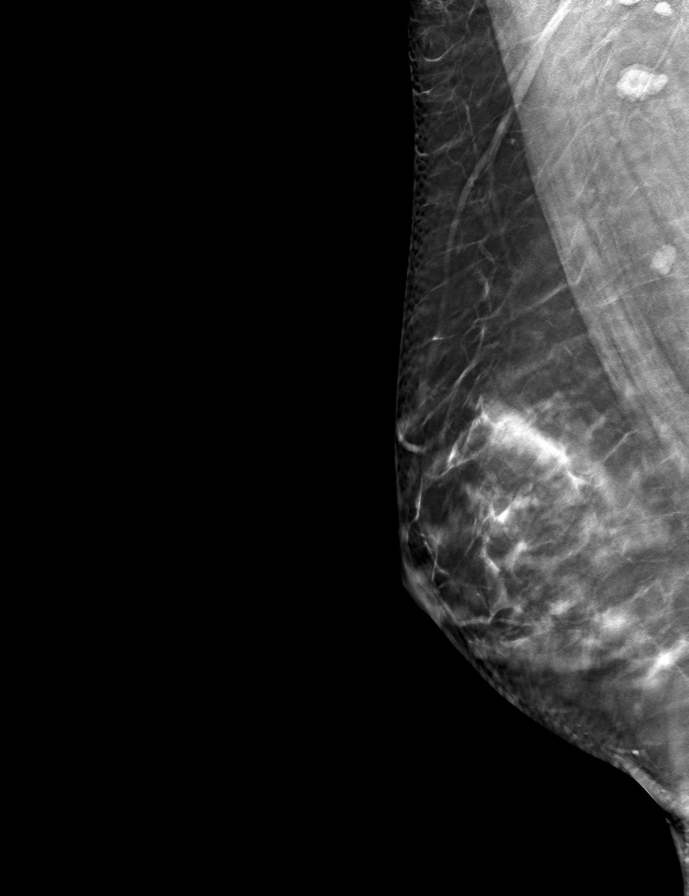
[frame 37/73]
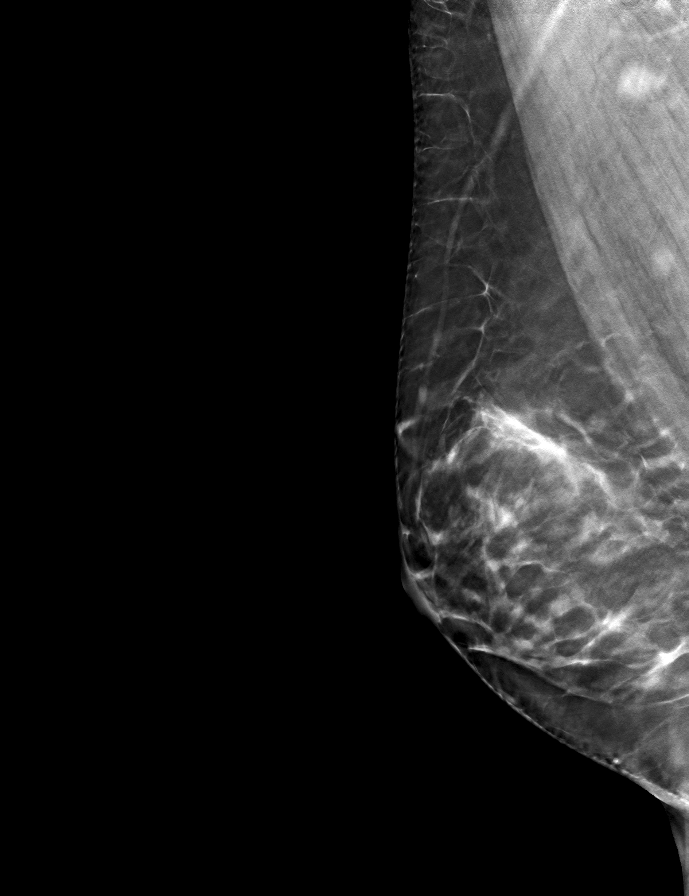

[L CC tomo · tomo slice 39/76.0]
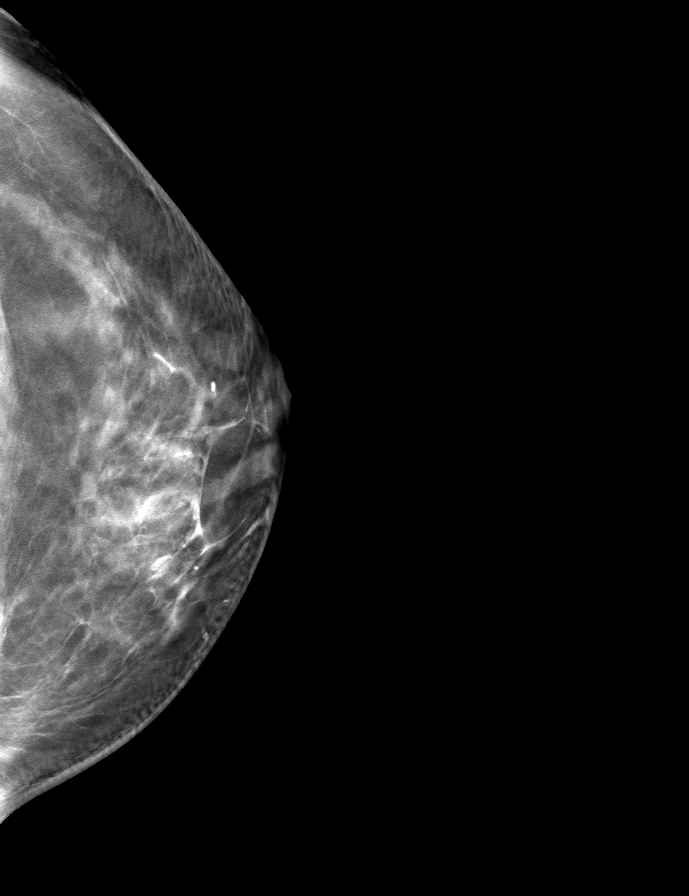

[L MLO tomo · tomo slice 35/70.0]
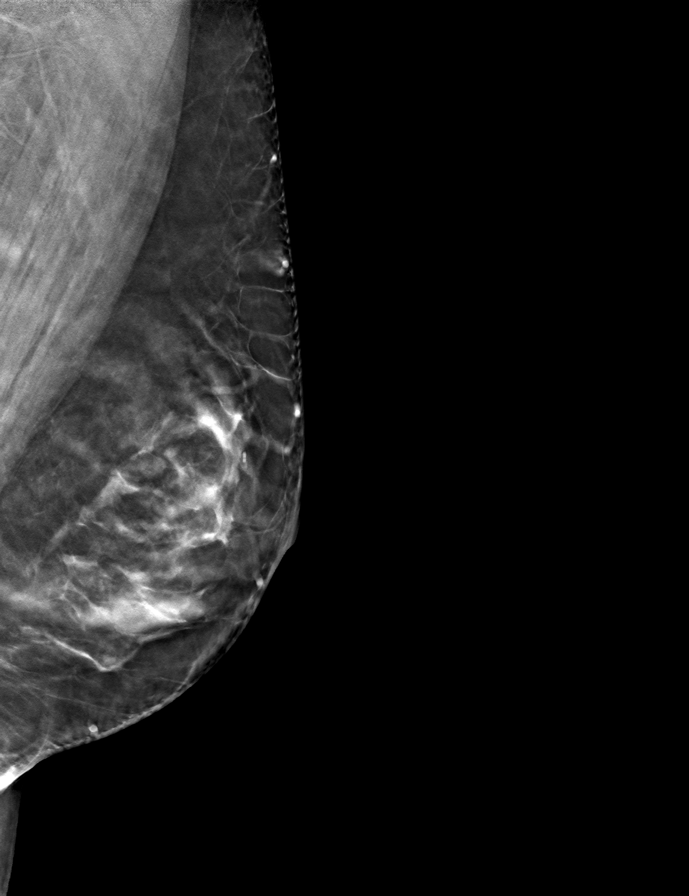

[R CC tomo · tomo slice 41/82.0]
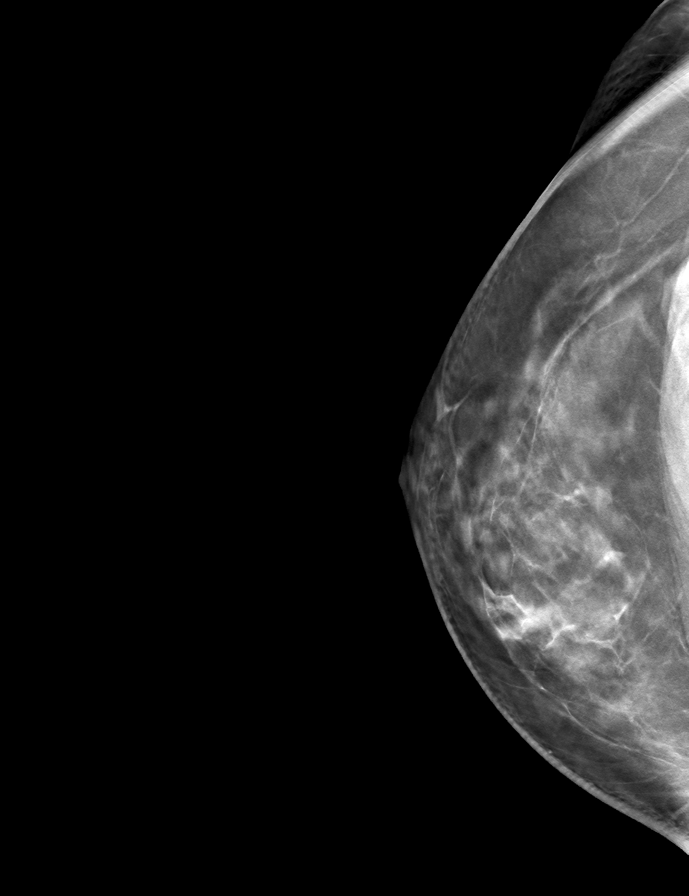

[9 of 24 positions shown; findings below may reference images not displayed]

ACR Breast Density Category c: The breast tissue is heterogeneously
dense, which may obscure small masses.
FINDINGS: There are no findings suspicious for malignancy. Images were
processed with CAD.
IMPRESSION: No mammographic evidence of malignancy. A result letter of this
screening mammogram will be mailed directly to the patient.

RECOMMENDATION:
Screening mammogram at age 40. (Code:[GE])

BI-RADS CATEGORY  1: Negative.

## 2020-11-10 ENCOUNTER — Other Ambulatory Visit: Payer: Self-pay | Admitting: *Deleted

## 2020-11-10 ENCOUNTER — Telehealth: Payer: Self-pay | Admitting: Oncology

## 2020-11-10 DIAGNOSIS — Z1501 Genetic susceptibility to malignant neoplasm of breast: Secondary | ICD-10-CM

## 2020-11-10 DIAGNOSIS — Z803 Family history of malignant neoplasm of breast: Secondary | ICD-10-CM

## 2020-11-10 DIAGNOSIS — Z1509 Genetic susceptibility to other malignant neoplasm: Secondary | ICD-10-CM

## 2020-11-10 DIAGNOSIS — Z1239 Encounter for other screening for malignant neoplasm of breast: Secondary | ICD-10-CM

## 2020-11-10 NOTE — Telephone Encounter (Signed)
Scheduled appt per 7/26 sch msg. Pt aware.  

## 2020-11-27 ENCOUNTER — Other Ambulatory Visit: Payer: Self-pay

## 2020-11-27 ENCOUNTER — Ambulatory Visit
Admission: RE | Admit: 2020-11-27 | Discharge: 2020-11-27 | Disposition: A | Discharge status: Home / Self Care | Payer: Commercial Managed Care - PPO | Source: Ambulatory Visit | Attending: Oncology | Admitting: Oncology

## 2020-11-27 DIAGNOSIS — Z1501 Genetic susceptibility to malignant neoplasm of breast: Secondary | ICD-10-CM

## 2020-11-27 DIAGNOSIS — Z1239 Encounter for other screening for malignant neoplasm of breast: Secondary | ICD-10-CM

## 2020-11-27 DIAGNOSIS — Z803 Family history of malignant neoplasm of breast: Secondary | ICD-10-CM

## 2020-11-27 IMAGING — MR MR BREAST BILAT WO/W CM
8 of 12 series · 32 of 48 positions shown · IV contrast (8 ML GADAVIST)
Comparison: MRI on [DATE], screening mammogram on [DATE]
and earlier studies

CLINICAL DATA: Breast cancer screening. Greater than 20% lifetime
risk. Strong family history of ovarian and breast cancer. Breast
cancer was diagnosed in her cousin at 25, maternal aunt at 40,
maternal grandmother at 39, and mother at 36. History of benign MR
guided core biopsy of the LEFT breast 12 o'clock location in
[DATE], showing fibrocystic change and pseudoangiomatous
stromal hyperplasia.

LABS:  None obtained at the time of imaging.
EXAM:
BILATERAL BREAST MRI WITH AND WITHOUT CONTRAST
TECHNIQUE: Multiplanar, multisequence MR images of both breasts were obtained
prior to and following the intravenous administration of 8 ml of
Gadavist

[Series 2: t2_tirm_tra ipat (a-p) · axial · 3.0mm · 0.70mm/px · 1 of 55 slices shown]
[im 1/55]
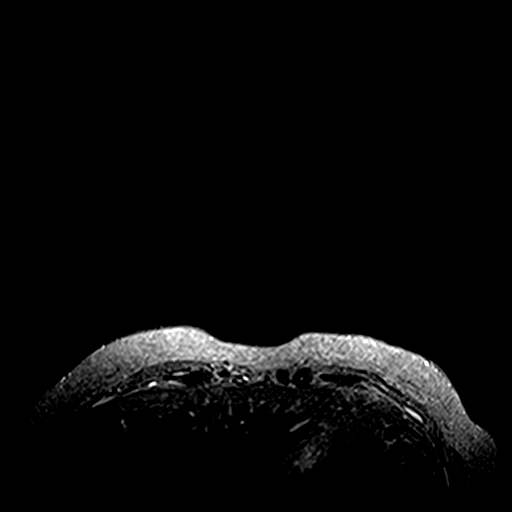

[Series 3: fl3d pre-cm no · axial · non-contrast · 1.2mm · 0.94mm/px · z∈[-55,+116]mm · 5 of 144 slices shown]
[im 1/144]
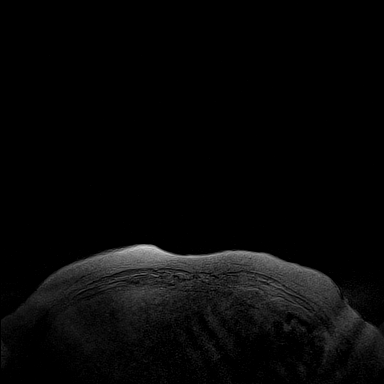
[im 36/144]
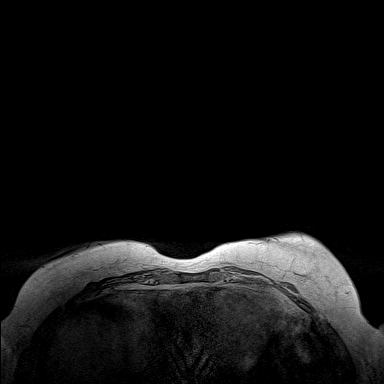
[im 72/144]
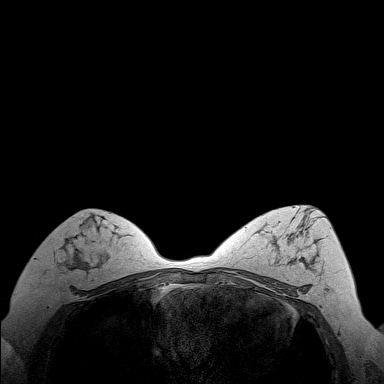
[im 108/144]
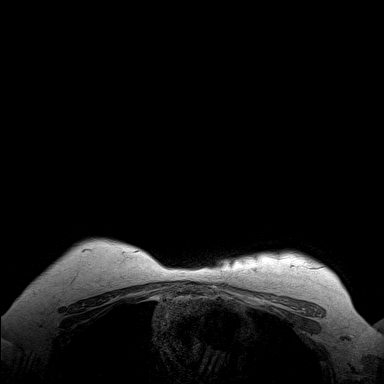
[im 144/144]
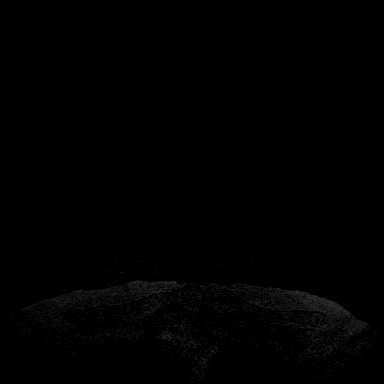

[Series 4: fl3d pre-cm · axial · non-contrast · 1.2mm · 0.94mm/px · z∈[-55,+116]mm · 5 of 144 slices shown]
[im 1/144]
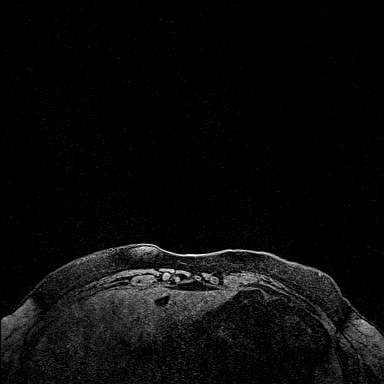
[im 36/144]
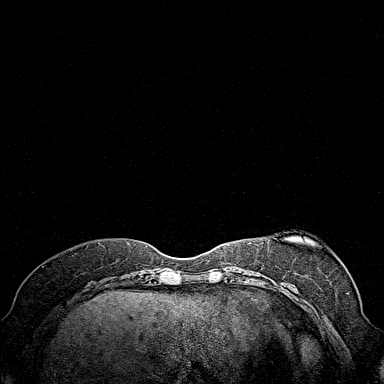
[im 72/144]
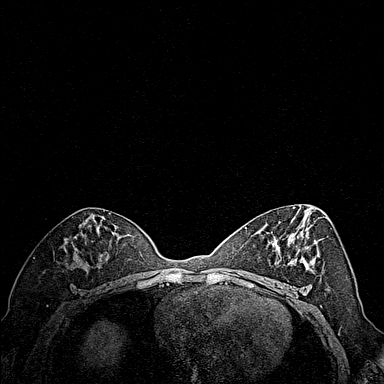
[im 108/144]
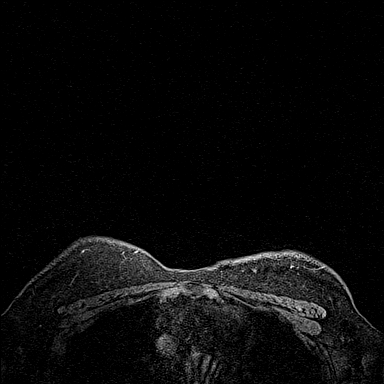
[im 144/144]
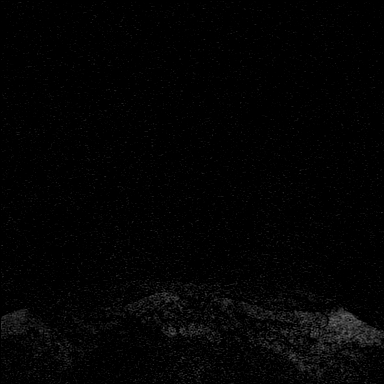

[Series 5: fl3d post-cm 20 · axial · 1.2mm · 0.94mm/px · z∈[-55,+116]mm · 5 of 144 slices shown (1 of 3)]
[im 1/144]
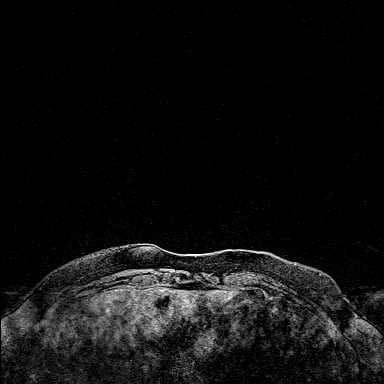
[im 36/144]
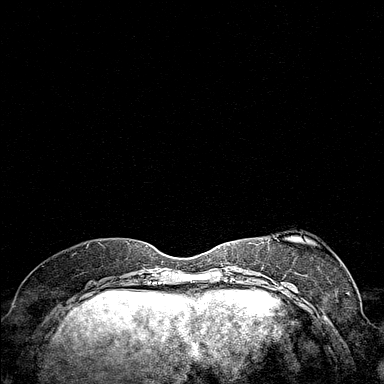
[im 72/144]
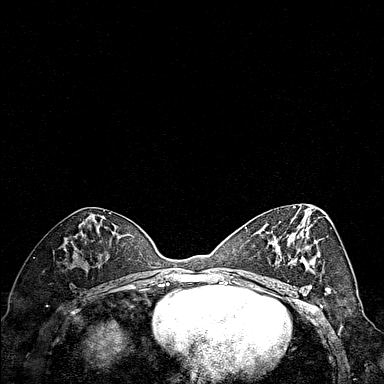
[im 108/144]
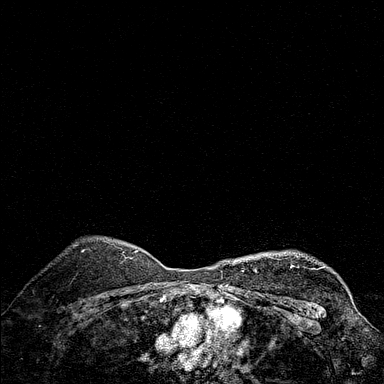
[im 144/144]
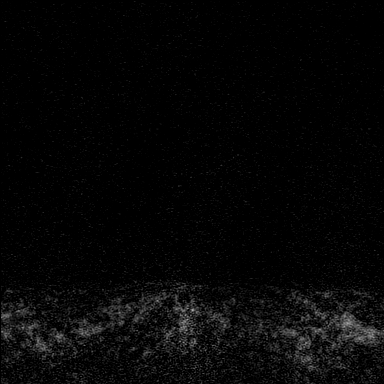

[Series 6: fl3d post-cm 20 · axial · 1.2mm · 0.94mm/px · z∈[-55,+116]mm · 5 of 144 slices shown (2 of 3)]
[im 1/144]
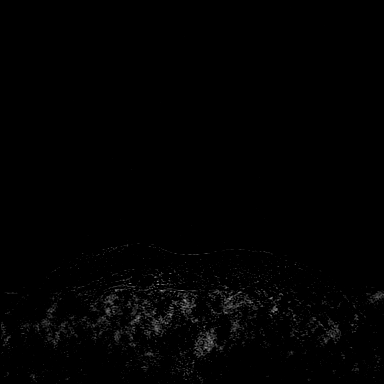
[im 36/144]
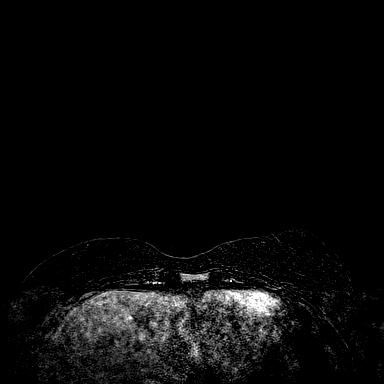
[im 72/144]
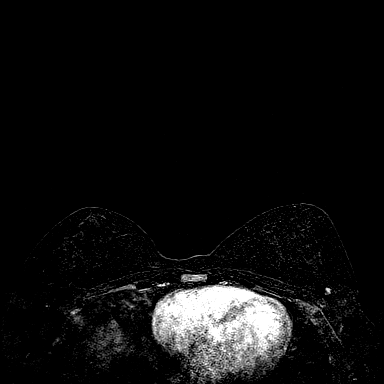
[im 108/144]
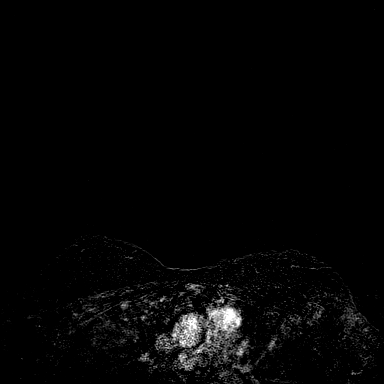
[im 144/144]
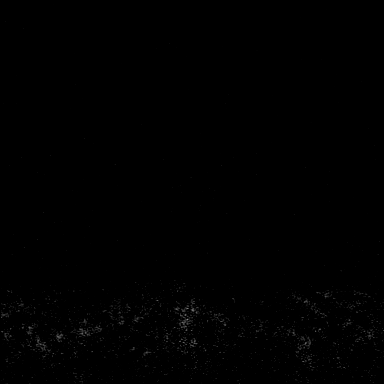

[Series 7: fl3d post-cm 20 · axial · 172.8mm · 0.94mm/px · 1 of 1 slices shown (3 of 3)]
[im 1/1]
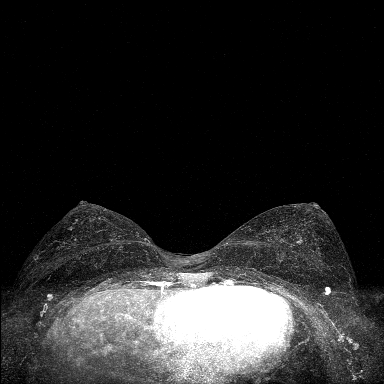

[Series 8: fl3d post-cm 3min · axial · 1.2mm · 0.94mm/px · z∈[-55,+116]mm · 6 of 144 slices shown]
[im 1/144]
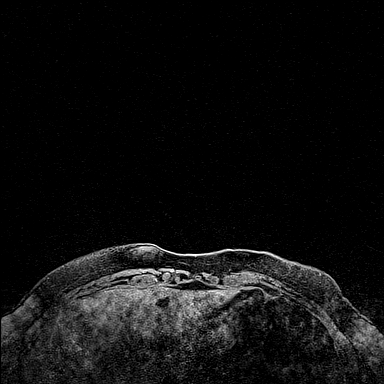
[im 29/144]
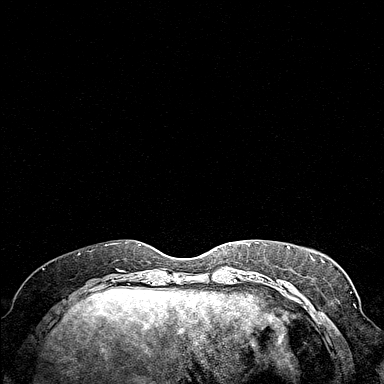
[im 58/144]
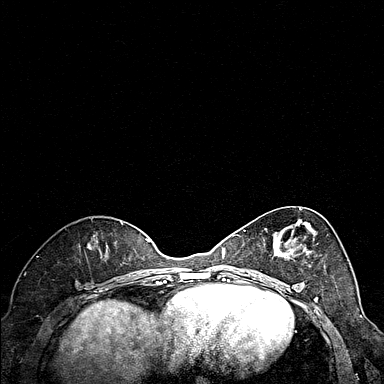
[im 86/144]
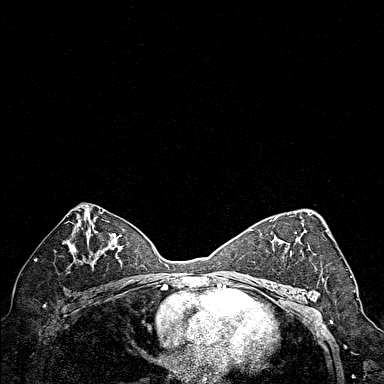
[im 115/144]
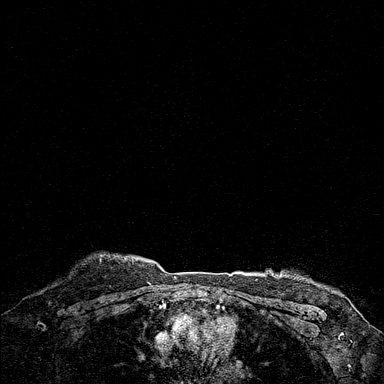
[im 144/144]
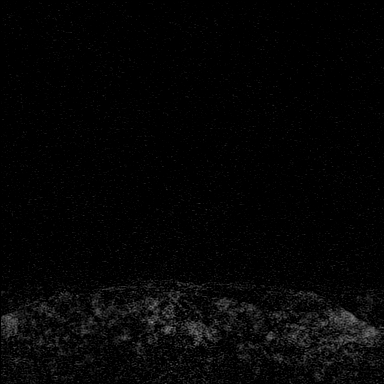

[Series 9: fl3d post-cm 3min_sub · axial · 1.2mm · 0.94mm/px · z∈[-55,+47]mm · 4 of 144 slices shown]
[im 1/144]
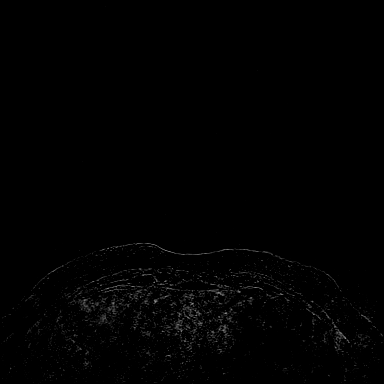
[im 29/144]
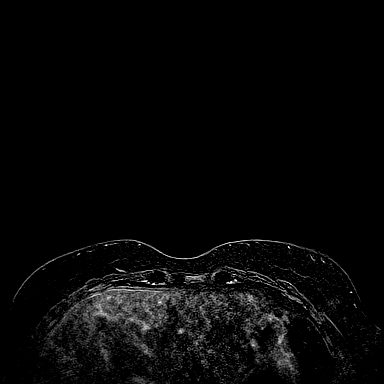
[im 58/144]
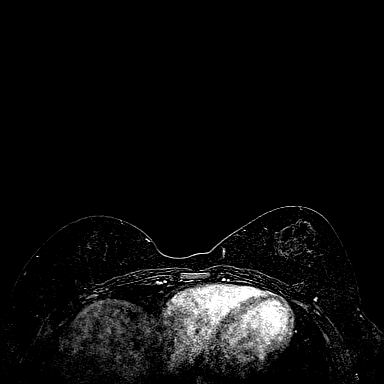
[im 86/144]
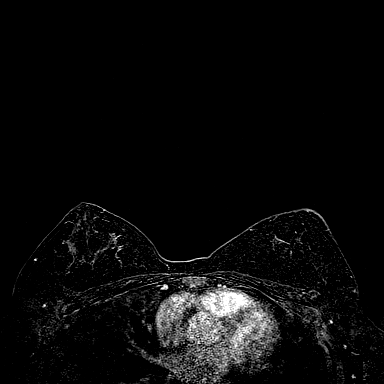

[32 of 48 positions shown; findings below may reference images not displayed]

Three-dimensional MR images were rendered by post-processing of the
original MR data on an independent workstation. The
three-dimensional MR images were interpreted, and findings are
reported in the following complete MRI report for this study. Three
dimensional images were evaluated at the independent interpreting
workstation using the DynaCAD thin client.
FINDINGS: Breast composition: c. Heterogeneous fibroglandular tissue.

Background parenchymal enhancement: Mild

Right breast: No mass or abnormal enhancement.

Left breast: No mass or abnormal enhancement. Stable appearance of
previously biopsied benign enhancing focus adjacent to tissue marker
clip in the 12 o'clock location of the LEFT breast, measuring 5
millimeters on image 65 of series 12.

Lymph nodes: No abnormal appearing lymph nodes.

Ancillary findings:  None.
IMPRESSION: No MRI evidence for malignancy.

RECOMMENDATION:
Based on the recommendations of the [employer], annual
screening MRI is suggested in addition to annual mammography if the
patient has an estimated lifetime risk of developing breast cancer
which is greater than 20%.

Screening mammogram is recommended in [DATE]

BI-RADS CATEGORY  1: Negative.

## 2020-11-27 MED ORDER — GADOBUTROL 1 MMOL/ML IV SOLN
8.0000 mL | Freq: Once | INTRAVENOUS | Status: AC | PRN
  Administered 2020-11-27: 8 mL via INTRAVENOUS

## 2020-12-06 NOTE — Progress Notes (Signed)
[place]  [phone] [phone]    ID: Terri Phillips DOB: 1986/02/02  MR#: [id number]  UGQ#:[id number]  Patient Care Team: Ardith Dark, Hershal Coria as PCP - General (Physician Assistant) Oaklynn Stierwalt, Virgie Dad, MD as Consulting Physician (Medical Oncology) Meisinger, Sherren Mocha, MD as Consulting Physician (Obstetrics and Gynecology) OTHER MD:   CHIEF COMPLAINT: Breast and ovarian cancer risk/BRCA1 positivity  CURRENT TREATMENT: Intensified screening   INTERVAL HISTORY: Terri Phillips returns today for follow up of her BRCA1 positivity. She was evaluated in the high risk cancer clinic on 01/28/2019.   Her most recent screening mammogram from 04/21/2020 showed no evidence of malignancy.  Her most recent breast MRI from 11/27/2020 was also negative for malignancy.  On 11/06/2019 she delivered baby boy Benwith Apgars of 8 and 9, weighing 354 9 g   REVIEW OF SYSTEMS: Sofiah exercises by walking which she does most days.  She has a Mirena IUD in place so no periods.  A detailed review of systems was otherwise stable.   COVID 19 VACCINATION STATUS: Status post Pfizer x2    HISTORY OF CURRENT ILLNESS: From the original intake note:  Terri Phillips was referred to [place] by Dr. Cheri Fowler at [facility] for her BRCA1 positivity and strong family history of breast cancer and ovarian cancer  [name] underwent genetic testing ordered by Dr Willis Modena on 11/03/2018 through the [facility] cancer panel. 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  She underwent a digital diagnostic unilateral left mammogram with tomography on 11/06/2018 for possible left breast asymmetry showing: Breast  Density Category C. There is no mammographic evidence of malignancy.   The patient's subsequent history is as detailed below.   PAST MEDICAL HISTORY: Past Medical History:  Diagnosis Date   Anxiety    BRCA1 gene mutation positive    Family history of BRCA1 gene positive    Family history of breast cancer     PAST SURGICAL HISTORY: Past Surgical History:  Procedure Laterality Date   BREAST BIOPSY     WISDOM TOOTH EXTRACTION      FAMILY HISTORY: Family History  Problem Relation Age of Onset   Cancer Mother    Breast cancer Mother 40   Breast cancer Maternal Aunt 31   Breast cancer Maternal Grandmother 41   Breast cancer Cousin 62  Terri Phillips's father is living as of October 2020 with no history of cancer.. Patients' mother is 62 years old as of October 2020.  She was diagnosed with breast cancer at age 1.  She carries a BRCA1 mutation.  She has undergone prophylactic bilateral salpingo-oophorectomy.  1 maternal aunt was diagnosed with breast cancer at age 33.  This and is known to be BRCA1 positive.  She had a daughter with breast cancer at age 33, also positive for the BRCA1 mutation.  The second maternal aunt is not known to have had cancer.  The patient does not know whether she has been tested for the mutation or not.  There is also a maternal uncle without cancer.  He has not been tested.  The maternal grandmother was diagnosed with breast cancer and ovarian cancer at ages 6 and 73 and died at age 9. On the paternal side there are 2 uncles and 1 aunt  with no history of cancers.  There is no history of cancer in the paternal grandparents.     GYNECOLOGIC HISTORY:  No LMP recorded. 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 Her husband, Terri Phillips, is an Chief Financial Officer at [employer].   There is son Terri Phillips is 51 months old as of October 2020.  The patient attends [employer].   ADVANCED DIRECTIVES: In the absence of any documents to the contrary the patient's husband is her healthcare power of attorney   HEALTH MAINTENANCE: Social History   Tobacco Use   Smoking status: Never   Smokeless tobacco: Never  Substance Use Topics   Alcohol use: No   Drug use: No    Colonoscopy: n/a  PAP: UTD/Meisinger  Bone density: n/a   No Known Allergies  Current Outpatient Medications  Medication Sig Dispense Refill   ibuprofen (ADVIL) 800 MG tablet Take 1 tablet (800 mg total) by mouth 3 (three) times daily. 60 tablet 1   Prenatal Vit-Fe Fumarate-FA (PRENATAL VITAMIN) 27-0.8 MG TABS Take by mouth.     sertraline (ZOLOFT) 100 MG tablet Take 100 mg by mouth at bedtime.      No current facility-administered medications for this visit.    OBJECTIVE: Young white woman in no acute distress  Vitals:   12/07/20 0833  BP: 125/68  Pulse: 72  Resp: 16  Temp: 97.9 F (36.6 C)  SpO2: 99%    Wt Readings from Last 3 Encounters:  12/07/20 180 lb 3.2 oz (81.7 kg)  11/06/19 170 lb (77.1 kg)  01/28/19 159 lb 4.8 oz (72.3 kg)   Body mass index is 30.93 kg/m.    ECOG FS:0 - Asymptomatic  Sclerae unicteric, EOMs intact Wearing a mask No cervical or supraclavicular adenopathy Lungs no rales or rhonchi Heart regular rate and rhythm Abd soft, nontender, positive bowel sounds MSK no focal spinal tenderness, no upper extremity lymphedema Neuro: nonfocal, well oriented, appropriate affect Breasts: I do not palpate any masses in either breast, there are no skin or nipple changes of concern.  Both axillae are benign.   LAB RESULTS:  CMP     Component Value Date/Time   NA 136 11/06/2019 0751   K 3.8 11/06/2019 0751   CL 107 11/06/2019 0751   CO2 20 (L) 11/06/2019 0751   GLUCOSE 107 (H) 11/06/2019 0751   BUN 9 11/06/2019 0751   CREATININE 0.58 11/06/2019 0751    CALCIUM 8.8 (L) 11/06/2019 0751   GFRNONAA >60 11/06/2019 0751   GFRAA >60 11/06/2019 0751   Lab Results  Component Value Date   WBC 12.1 (H) 11/07/2019   HGB 11.6 (L) 11/07/2019   HCT 36.2 11/07/2019   MCV 92.8 11/07/2019   PLT 149 (L) 11/07/2019    No results found for: LABCA2  No components found for: [id number]  No results for input(s): INR in the last 168 hours.  No results found for: LABCA2  No results found for: IHK742  No results found for: VZD638  No results found for: VFI433  No results found for: CA2729  No components found for: HGQUANT  No results found for: CEA1 / No results found for: CEA1   No results found for: AFPTUMOR  No results found for: CHROMOGRNA  No results found for: TOTALPROTELP, ALBUMINELP, A1GS, A2GS, BETS,  BETA2SER, GAMS, MSPIKE, SPEI (this displays SPEP labs)  No results found for: KPAFRELGTCHN, LAMBDASER, KAPLAMBRATIO (kappa/lambda light chains)  No results found for: HGBA, HGBA2QUANT, HGBFQUANT, HGBSQUAN (Hemoglobinopathy evaluation)   No results found for: LDH  No results found for: IRON, TIBC, IRONPCTSAT (Iron and TIBC)  No results found for: FERRITIN  Urinalysis No results found for: COLORURINE, APPEARANCEUR, LABSPEC, PHURINE, GLUCOSEU, HGBUR, BILIRUBINUR, KETONESUR, PROTEINUR, UROBILINOGEN, NITRITE, LEUKOCYTESUR   STUDIES:  MR BREAST BILATERAL W WO CONTRAST INC CAD  Result Date: 11/27/2020 CLINICAL DATA:  Breast cancer screening. Greater than 20% lifetime risk. Strong family history of ovarian and breast cancer. Breast cancer was diagnosed in her cousin at 51, maternal aunt at 75, maternal grandmother at 58, and mother at 16. History of benign MR guided core biopsy of the LEFT breast 12 o'clock location in November 2020, showing fibrocystic change and pseudoangiomatous stromal hyperplasia. LABS:  None obtained at the time of imaging. EXAM: BILATERAL BREAST MRI WITH AND WITHOUT CONTRAST TECHNIQUE: Multiplanar, multisequence  MR images of both breasts were obtained prior to and following the intravenous administration of 8 ml of Gadavist Three-dimensional MR images were rendered by post-processing of the original MR data on an independent workstation. The three-dimensional MR images were interpreted, and findings are reported in the following complete MRI report for this study. Three dimensional images were evaluated at the independent interpreting workstation using the DynaCAD thin client. COMPARISON:  MRI on 03/05/2019, screening mammogram on 04/21/2020 and earlier studies FINDINGS: Breast composition: c. Heterogeneous fibroglandular tissue. Background parenchymal enhancement: Mild Right breast: No mass or abnormal enhancement. Left breast: No mass or abnormal enhancement. Stable appearance of previously biopsied benign enhancing focus adjacent to tissue marker clip in the 12 o'clock location of the LEFT breast, measuring 5 millimeters on image 65 of series 12. Lymph nodes: No abnormal appearing lymph nodes. Ancillary findings:  None. IMPRESSION: No MRI evidence for malignancy. RECOMMENDATION: Based on the recommendations of the Susan Moore, annual screening MRI is suggested in addition to annual mammography if the patient has an estimated lifetime risk of developing breast cancer which is greater than 20%. Screening mammogram is recommended in January 2023 BI-RADS CATEGORY  1: Negative. Electronically Signed   By: Nolon Nations M.D.   On: 11/27/2020 11:07    ELIGIBLE FOR AVAILABLE RESEARCH PROTOCOL: no   ASSESSMENT: 35 y.o. , [place] woman at high risk for breast and ovarian cancer due to a pathogenic mutation in the BRCA1 gene.  (1) genetic testing 11/03/2018 through the [facility] MDx cancer panel found a pathogenic variant in BRCA1 called c.5152+1G>C.  (a) there were no additional mutations noted in APC, ATM, BARD1, BMPR1A, BRCA1, BRCA2, BRIP1, CDH1, CDK4, CDKN2A, CHEK2, DICER1, EPCAM, FANCC, GREM1, MLH1,  MSH2, MSH6, MRE11, MUTYH, NBN, PABL2,PMS2, POLD1, POLE2, PTEN, RAD51C, RAD51D, SMAD4, SMARCA4, STK11 and TP53.  (2) breast cancer risk: Intensified screening  (a) breast MRI every October  (b) breast mammography every April  (c) biannual MD breast exam   (3) ovarian cancer risk: no effective screening available  (a) plan bilateral salpingo-oophorectomy age 49-40 or as soon as family planning complete  (B) estrogen-only hormone replacement therapy given until age 3 does not increase the risk of breast cancer in this population (JAMA Oncol. 2018 Aug 1;4(8):1139. doi: 10.1001/jamaoncol.9379.0240. PMID: 97353299   (4) pancreatic cancer risk: no screening available   PLAN: We reviewed Karissa's risk and she understands that so long as she is having her mammogram/MRI every 6 months with intensified screening she may  postpone bilateral mastectomies indefinitely.  She is very comfortable with this plan.  At some point in the future she might decide to proceed with bilateral nipple sparing mastectomies.  She understands that we do not have screening for ovarian cancer.  Generally we recommend bilateral salpingo-oophorectomy between the ages of 31 and 60 in this population.  She is not sure whether or not they are done with family-planning and they have at least 2 or 3 years to figure that out.  Once they are done though I would recommend to proceed with bilateral salpingo-oophorectomy.  She understands that we will be able to give her hormone replacement with estrogen until the age of 100 with no increase in breast cancer risk.  I went ahead and entered the order for a mammogram in January.  She will see Dr. Marvel Plan in February.  She will have her MRI in August and see [place] again after that test  Total encounter time 25 minutes.  Milford Cilento, Virgie Dad, MD  12/07/20 9:17 AM Medical Oncology and Hematology [facility] [place], [place] [address] Tel. [phone]    Fax.  [phone]    I, Wilburn Mylar, am acting as scribe for Dr. Virgie Dad. Amahia Madonia.   I, Lurline Del MD, have reviewed the above documentation for accuracy and completeness, and I agree with the above.    *Total Encounter Time as defined by the Centers for Medicare and Medicaid Services includes, in addition to the face-to-face time of a patient visit (documented in the note above) non-face-to-face time: obtaining and reviewing outside history, ordering and reviewing medications, tests or procedures, care coordination (communications with other health care professionals or caregivers) and documentation in the medical record.

## 2020-12-07 ENCOUNTER — Other Ambulatory Visit: Payer: Self-pay

## 2020-12-07 ENCOUNTER — Inpatient Hospital Stay: Payer: Commercial Managed Care - PPO | Attending: Oncology | Admitting: Oncology

## 2020-12-07 VITALS — BP 125/68 | HR 72 | Temp 97.9°F | Resp 16 | Ht 64.0 in | Wt 180.2 lb

## 2020-12-07 DIAGNOSIS — N62 Hypertrophy of breast: Secondary | ICD-10-CM | POA: Insufficient documentation

## 2020-12-07 DIAGNOSIS — R922 Inconclusive mammogram: Secondary | ICD-10-CM

## 2020-12-07 DIAGNOSIS — Z9189 Other specified personal risk factors, not elsewhere classified: Secondary | ICD-10-CM

## 2020-12-07 DIAGNOSIS — Z79890 Hormone replacement therapy (postmenopausal): Secondary | ICD-10-CM | POA: Insufficient documentation

## 2020-12-07 DIAGNOSIS — Z8041 Family history of malignant neoplasm of ovary: Secondary | ICD-10-CM | POA: Insufficient documentation

## 2020-12-07 DIAGNOSIS — Z1501 Genetic susceptibility to malignant neoplasm of breast: Secondary | ICD-10-CM | POA: Diagnosis not present

## 2020-12-07 DIAGNOSIS — Z1502 Genetic susceptibility to malignant neoplasm of ovary: Secondary | ICD-10-CM | POA: Diagnosis not present

## 2020-12-07 DIAGNOSIS — Z975 Presence of (intrauterine) contraceptive device: Secondary | ICD-10-CM

## 2020-12-07 DIAGNOSIS — Z1509 Genetic susceptibility to other malignant neoplasm: Secondary | ICD-10-CM

## 2020-12-07 DIAGNOSIS — Z803 Family history of malignant neoplasm of breast: Secondary | ICD-10-CM | POA: Diagnosis not present

## 2021-04-23 ENCOUNTER — Ambulatory Visit
Admission: RE | Admit: 2021-04-23 | Discharge: 2021-04-23 | Disposition: A | Discharge status: Home / Self Care | Payer: Commercial Managed Care - PPO | Source: Ambulatory Visit | Attending: Oncology | Admitting: Oncology

## 2021-04-23 DIAGNOSIS — Z9189 Other specified personal risk factors, not elsewhere classified: Secondary | ICD-10-CM

## 2021-04-27 ENCOUNTER — Telehealth: Payer: Self-pay | Admitting: Adult Health

## 2021-04-27 NOTE — Telephone Encounter (Signed)
Sch per 1/9 inbasket, pt aware °

## 2021-07-27 ENCOUNTER — Encounter: Payer: Self-pay | Admitting: Hematology

## 2021-07-30 ENCOUNTER — Other Ambulatory Visit: Payer: Self-pay | Admitting: *Deleted

## 2021-07-30 DIAGNOSIS — Z1501 Genetic susceptibility to malignant neoplasm of breast: Secondary | ICD-10-CM

## 2021-07-30 DIAGNOSIS — Z9189 Other specified personal risk factors, not elsewhere classified: Secondary | ICD-10-CM

## 2021-07-30 NOTE — Progress Notes (Addendum)
Referral placed for new patient services due to relocation.  Dr. Ceasar Mons @ [facility] in [place].  Referral faxed to [phone].  Phone # [phone]. ?

## 2021-08-02 ENCOUNTER — Telehealth: Payer: Self-pay

## 2021-08-02 NOTE — Telephone Encounter (Signed)
Pt was made aware that her referral to Dr. Rogue Bussing at [facility] was faxed but was sent back with a message stating that Dr. Rogue Bussing is no longer seeing patients there. Only at [facility] or [facility]. Pt states she will call and see what other doctors she could see and she will give the office a call if she has any questions or concerns.  ?

## 2021-08-19 ENCOUNTER — Other Ambulatory Visit: Payer: Self-pay

## 2021-08-19 DIAGNOSIS — Z1501 Genetic susceptibility to malignant neoplasm of breast: Secondary | ICD-10-CM

## 2021-08-19 DIAGNOSIS — N6320 Unspecified lump in the left breast, unspecified quadrant: Secondary | ICD-10-CM

## 2021-12-31 ENCOUNTER — Other Ambulatory Visit: Payer: Commercial Managed Care - PPO

## 2021-12-31 ENCOUNTER — Ambulatory Visit: Payer: Commercial Managed Care - PPO | Admitting: Hematology
